# Patient Record
Sex: Male | Born: 1940 | Race: White | Hispanic: No | Smoking: Never smoker
Health system: Southern US, Community
[De-identification: ages and names within clinical notes are randomized; demographics above are authoritative.]

## PROBLEM LIST (undated history)

## (undated) DIAGNOSIS — B958 Unspecified staphylococcus as the cause of diseases classified elsewhere: Secondary | ICD-10-CM

## (undated) DIAGNOSIS — G709 Myoneural disorder, unspecified: Secondary | ICD-10-CM

## (undated) DIAGNOSIS — M778 Other enthesopathies, not elsewhere classified: Secondary | ICD-10-CM

## (undated) DIAGNOSIS — E119 Type 2 diabetes mellitus without complications: Secondary | ICD-10-CM

## (undated) DIAGNOSIS — Z860100 Personal history of colon polyps, unspecified: Secondary | ICD-10-CM

## (undated) DIAGNOSIS — C801 Malignant (primary) neoplasm, unspecified: Secondary | ICD-10-CM

## (undated) DIAGNOSIS — N400 Enlarged prostate without lower urinary tract symptoms: Secondary | ICD-10-CM

## (undated) DIAGNOSIS — E785 Hyperlipidemia, unspecified: Secondary | ICD-10-CM

## (undated) DIAGNOSIS — N183 Chronic kidney disease, stage 3 unspecified: Secondary | ICD-10-CM

## (undated) DIAGNOSIS — H9190 Unspecified hearing loss, unspecified ear: Secondary | ICD-10-CM

## (undated) DIAGNOSIS — I10 Essential (primary) hypertension: Secondary | ICD-10-CM

## (undated) DIAGNOSIS — M1990 Unspecified osteoarthritis, unspecified site: Secondary | ICD-10-CM

## (undated) DIAGNOSIS — K469 Unspecified abdominal hernia without obstruction or gangrene: Secondary | ICD-10-CM

## (undated) DIAGNOSIS — J189 Pneumonia, unspecified organism: Secondary | ICD-10-CM

## (undated) DIAGNOSIS — E039 Hypothyroidism, unspecified: Secondary | ICD-10-CM

## (undated) DIAGNOSIS — I499 Cardiac arrhythmia, unspecified: Secondary | ICD-10-CM

## (undated) DIAGNOSIS — Z87898 Personal history of other specified conditions: Secondary | ICD-10-CM

## (undated) DIAGNOSIS — Z87442 Personal history of urinary calculi: Secondary | ICD-10-CM

## (undated) DIAGNOSIS — H9319 Tinnitus, unspecified ear: Secondary | ICD-10-CM

## (undated) HISTORY — DX: Type 2 diabetes mellitus without complications: E11.9

## (undated) HISTORY — DX: Essential (primary) hypertension: I10

## (undated) HISTORY — DX: Benign prostatic hyperplasia without lower urinary tract symptoms: N40.0

## (undated) HISTORY — PX: SHOULDER ARTHROSCOPY: SHX128

## (undated) HISTORY — DX: Hypothyroidism, unspecified: E03.9

## (undated) HISTORY — DX: Chronic kidney disease, stage 3 (moderate): N18.3

## (undated) HISTORY — DX: Hyperlipidemia, unspecified: E78.5

## (undated) HISTORY — DX: Chronic kidney disease, stage 3 unspecified: N18.30

---

## 1968-11-02 HISTORY — PX: TONSILLECTOMY: SUR1361

## 1972-03-04 DIAGNOSIS — C801 Malignant (primary) neoplasm, unspecified: Secondary | ICD-10-CM

## 1972-03-04 HISTORY — DX: Malignant (primary) neoplasm, unspecified: C80.1

## 1972-03-04 HISTORY — PX: EYE SURGERY: SHX253

## 1973-03-04 HISTORY — PX: VASECTOMY: SHX75

## 1983-03-05 HISTORY — PX: BACK SURGERY: SHX140

## 1997-09-28 ENCOUNTER — Other Ambulatory Visit: Admission: RE | Admit: 1997-09-28 | Discharge: 1997-09-28 | Payer: Self-pay | Admitting: Urology

## 1997-10-04 ENCOUNTER — Encounter: Admission: RE | Admit: 1997-10-04 | Discharge: 1998-01-02 | Payer: Self-pay | Admitting: Family Medicine

## 1998-08-14 ENCOUNTER — Encounter (HOSPITAL_COMMUNITY): Admission: RE | Admit: 1998-08-14 | Discharge: 1998-10-02 | Payer: Self-pay | Admitting: Psychiatry

## 1999-06-11 ENCOUNTER — Encounter (INDEPENDENT_AMBULATORY_CARE_PROVIDER_SITE_OTHER): Payer: Self-pay | Admitting: *Deleted

## 1999-06-11 ENCOUNTER — Ambulatory Visit (HOSPITAL_COMMUNITY): Admission: RE | Admit: 1999-06-11 | Discharge: 1999-06-11 | Payer: Self-pay | Admitting: *Deleted

## 2000-01-03 ENCOUNTER — Encounter: Payer: Self-pay | Admitting: Specialist

## 2000-01-03 ENCOUNTER — Ambulatory Visit (HOSPITAL_COMMUNITY): Admission: RE | Admit: 2000-01-03 | Discharge: 2000-01-03 | Payer: Self-pay | Admitting: Specialist

## 2000-08-01 ENCOUNTER — Encounter: Payer: Self-pay | Admitting: Specialist

## 2000-08-08 ENCOUNTER — Observation Stay (HOSPITAL_COMMUNITY): Admission: RE | Admit: 2000-08-08 | Discharge: 2000-08-08 | Payer: Self-pay | Admitting: Specialist

## 2001-08-19 ENCOUNTER — Encounter: Admission: RE | Admit: 2001-08-19 | Discharge: 2001-08-19 | Payer: Self-pay | Admitting: Family Medicine

## 2001-08-19 ENCOUNTER — Encounter: Payer: Self-pay | Admitting: Family Medicine

## 2002-03-04 HISTORY — PX: BICEPS TENDON REPAIR: SHX566

## 2004-03-04 HISTORY — PX: SMALL INTESTINE SURGERY: SHX150

## 2005-02-04 ENCOUNTER — Inpatient Hospital Stay (HOSPITAL_COMMUNITY): Admission: EM | Admit: 2005-02-04 | Discharge: 2005-02-11 | Payer: Self-pay | Admitting: *Deleted

## 2006-09-21 ENCOUNTER — Ambulatory Visit: Payer: Self-pay | Admitting: Emergency Medicine

## 2007-09-05 ENCOUNTER — Emergency Department: Payer: Self-pay | Admitting: Emergency Medicine

## 2007-09-29 ENCOUNTER — Encounter (INDEPENDENT_AMBULATORY_CARE_PROVIDER_SITE_OTHER): Payer: Self-pay | Admitting: General Surgery

## 2007-09-29 ENCOUNTER — Ambulatory Visit (HOSPITAL_BASED_OUTPATIENT_CLINIC_OR_DEPARTMENT_OTHER): Admission: RE | Admit: 2007-09-29 | Discharge: 2007-09-29 | Payer: Self-pay | Admitting: General Surgery

## 2008-09-13 HISTORY — PX: NM MYOCAR PERF WALL MOTION: HXRAD629

## 2008-09-13 HISTORY — PX: US ECHOCARDIOGRAPHY: HXRAD669

## 2010-03-04 HISTORY — PX: COLONOSCOPY W/ POLYPECTOMY: SHX1380

## 2010-03-25 ENCOUNTER — Encounter: Payer: Self-pay | Admitting: Family Medicine

## 2010-05-23 ENCOUNTER — Other Ambulatory Visit: Payer: Self-pay | Admitting: *Deleted

## 2010-07-17 NOTE — Op Note (Signed)
[name], [name]                 ACCOUNT NO.:  [id number]   MEDICAL RECORD NO.:  [id number]          PATIENT TYPE:  AMB   LOCATION:  [facility]                         FACILITY:  [facility]   PHYSICIAN:  Angelia Mould. Derrell Lolling, M.D.DATE OF BIRTH:  09/30/1940   DATE OF PROCEDURE:  09/29/2007  DATE OF DISCHARGE:                               OPERATIVE REPORT   PREOPERATIVE DIAGNOSIS:  Soft tissue mass left back, 8 cm diameter.   POSTOPERATIVE DIAGNOSIS:  Soft tissue mass left back, 8 cm diameter.   OPERATION PERFORMED:  Excision 8 cm soft tissue mass left back.   SURGEON:  Dr. Claud Kelp.   INDICATIONS FOR PROCEDURE:  This is a 70 year old white man who noticed  a large lump on his left back below the left scapular tip about 8 months  ago.  It has not been painful.  Dr. Foy Guadalajara obtained a CT scan and an  ultrasound which suggested that this was a fatty tumor which was well  circumscribed.  On exam, the patient has what appears to be an 8-10 cm  soft tissue mass at the left back below the scapular tip.  There is no  adenopathy.  There is no overlying skin change.  I felt that this is  more likely a benign lipoma, but because of its significant size, I  agreed that excision was a very reasonable option and the patient wanted  to have that done.  He is brought to the operating room electively.   DESCRIPTION OF PROCEDURE:  Following the induction of general  endotracheal anesthesia, the patient was placed in the right lateral  decubitus position with appropriate padding and axillary roll.  The  patient was identified as correct patient, correct procedure and correct  site.  The left upper back and lateral chest wall were prepped and  draped in a sterile fashion.  Intravenous antibiotics were given.  I  used 0.5% Marcaine with epinephrine as a local infiltration anesthetic.  A linear, somewhat oblique, scar was made overlying the palpable mass.  Dissection was carried down through the skin and then  through the  superficial subcutaneous tissue.  I then entered the plane of the soft  tissue mass and with traction and counter traction and using  electrocautery, I dissected all the way around this.  This appeared to  be a firm well-circumscribed lipoma but had extended down into the  muscle fibers and had to be carefully dissected away from muscle fibers  to completely excise this mass.  Eventually, I got circumferential  excision in its entirety and removed this in one-piece en bloc.  The  specimen was sent for routine histology.  I examined the wound and it  appeared that all the tumor had been removed.  The wound was irrigated  with saline.  Hemostasis was excellent.  It appeared that I could close  the wound primarily without drainage as I could bring the tissue planes  together and obliterate the dead space.  I used about 6 interrupted  sutures of 3-0 Vicryl to do this, taking the deep subcutaneous tissue  and  the fascia on top of the muscle and after I placed all these  stitches, I then tied them down and it nicely obliterated the dead space  without any deformity.  Skin was closed with skin staples.  Clean  bandages placed and the patient taken to the recovery room in stable  condition.   ESTIMATED BLOOD LOSS:  About 10-15 mL.   COMPLICATIONS:  None.   Sponge, needle and instrument counts were correct.      Angelia Mould. Derrell Lolling, M.D.  Electronically Signed     HMI/MEDQ  D:  09/29/2007  T:  09/29/2007  Job:  [id number]   cc:   [name], M.D.  Fax: [phone]

## 2010-07-20 NOTE — Procedures (Signed)
[place]  Patient:    AMELIA, MACKEN                        MRN: [id number] Proc. Date: 06/11/99 Adm. Date:  [id number] Attending:  Mingo Amber CC:         Marinda Elk, M.D.                           Procedure Report  PROCEDURE:  Video colonoscopy.  INDICATIONS:  A 70 year old male whos father died of colon cancer at age 33.  PREPARATION:  He is npo since midnight having taken Phospho-soda prep and a clear liquid diet yesterday.  The mucosa throughout is clean.  DEPTH OF INSERTION:  Cecum.  PREPROCEDURE SEDATION:  He received a total of 75 mg of Demerol and 6 mg of Versed intravenously.  In addition he was on 2 liters of nasal cannula O2.  PROCEDURE:  The Olympus Video colonoscope was inserted via the rectum and advanced easily all the way to the hepatic flexure.  At this point extra abdominal pressure was required in order to attain the cecum.  The cecal landmarks were identified and photographed.  On withdrawal the mucosa was carefully evaluated.  The entire colon itself appeared normal.  At the anal verge in the rectum was a diminutive nodule that was quite firm that was biopsied to rule out a neoplastic lesion.  No other areas of neoplasia, inflammation, or diverticulosis were noted.  The patient tolerated the procedure well.  Pulse, blood pressure and oximetry testing were stable throughout.  He was observed in recovery for 45 minutes and discharged home, alert with a benign abdomen.  IMPRESSION: 1. Rectal nodule of doubtful significance. 2. Otherwise normal colonoscopy.  PLAN:  The patient will be advised of the polyp histology via a letter and I will notify him of any necessary follow up.  In light of his family history if this biopsy proves to be nothing of concern he still should consider having a repeat  colonoscopy in 5-10 years. DD:  06/11/99 TD:  06/12/99 Job: [id number]

## 2010-07-20 NOTE — Op Note (Signed)
[facility]  Patient:    [name], [name]                        MRN: [id number] Proc. Date: 08/08/00 Adm. Date:  [id number] Disc. Date: [id number] Attending:  Pierce Crane                           Operative Report  PREOPERATIVE DIAGNOSES: 1. Osteoarthrosis of the left shoulder. 2. Impingement syndrome.  POSTOPERATIVE DIAGNOSES: 1. Osteoarthrosis of the left shoulder. 2. Impingement syndrome.  OPERATION: 1. Left shoulder arthroscopy. 2. Subacromial decompression and bursectomy. 3. Lavage of humeral joint.  SURGEON:  Javier Docker, M.D.  BRIEF HISTORY: This is a 70 year old with refractory left shoulder pain.  MRI indicates end-stage osteoarthrosis of the shoulder.  This demonstrated positive impingement sign, secondary impingement sign.  Initially tender over the [facility] in the office.  Preoperatively, asked if he could avoid open procedure.   ______ the [facility] the positive impingement sign on internal and external rotation of shoulder.  Risks and benefits discussed including bleeding, infection, damage to  vascular structures, no change in symptoms, worsening symptoms, need for total shoulder arthroplasty in the future, need for procedure, etc.  TECHNIQUE:  The patient was placed supine in beach chair position after induction of adequate general anesthesia.  Kefzol 1 g given and left upper extremity, shoulder, and precordial regions prepped and draped in usual sterile fashion.   Surgical marking was utilized to delineate the acromion, clavicle, AC joint.  An Incision was made over the anterolateral aspect of the acromion to the skin only as well as the posterior inferior aspect of the shoulder through the skin only.  Initially, the shoulder was abducted.  The cannula advanced in line with the coracoid to the glenohumeral space.  There was significant contracture of the capsule, and full passage of the arthroscopic camera in the glenohumeral  joint was not obtainable.  It was felt that just the outer portion was penetrated.  It was just lavaged, hoping that some of the lavage would flush the glenohumeral joint.  Again in multiple passes, but the glenohumeral joint was not able to be "arthroscoped."  Review of his MRI demonstrated again the severe osteoarthrosis and spurring.  The camera was then redirected into the subacromial space, and a cannula was placed over the lateral aspect of the acromion.  An 18-gauge needle was utilized to localize the anterolateral aspect of the acromion and to the [facility] joint.  Significant subacromial bursa noted, and a bursectomy was performed with a 42 coude shaver.  Full inspection of the rotator cuff and internal and external rotation revealed no evidence of a tear.  There was a small spur on the anterior aspect of the acromion which was removed with a high-speed bur. There was no impingement noted from of the inferior aspect of the clavicle. There was good spacing in the subacromial joint.  Following bursectomy, again the rotator cuff felt to be intact.  It was felt that, due to his active tenderness over the [facility] joint preoperatively and that he would most likely require total shoulder arthroplasty, elected not to perform an open procedure. There was not enough motion on the clavicle delivered into the subacromial space to consider arthroscopic decompression.  It was, therefore, felt that the patient would most benefit from just the bursectomy that was performed, nd he would most likely need to proceed with a  total shoulder arthroplasty as we had discussed earlier.  Full internal and external rotation, full bursa had been excised.  There was no tear on the rotator cuff.  It was probed.  No impingement with range of motion.  All instrumentation was then removed. Portals were closed with 4-0 nylon interrupted sutures.  The wound was dressed sterilely.  He was placed in a sling, extubated without  difficulty, and transported to recovery room in satisfactory condition.  The patient tolerated the procedure well without complications. DD:  08/08/00 TD:  08/09/00 Job: [id number]

## 2010-07-20 NOTE — Op Note (Signed)
[name], [name]                 ACCOUNT NO.:  [id number]   MEDICAL RECORD NO.:  [id number]          PATIENT TYPE:  INP   LOCATION:  5505                         FACILITY:  [facility]   PHYSICIAN:  Angelia Mould. Derrell Lolling, M.D.DATE OF BIRTH:  Jan 11, 1941   DATE OF PROCEDURE:  02/05/2005  DATE OF DISCHARGE:                                 OPERATIVE REPORT   PREOPERATIVE DIAGNOSIS:  Acute mechanical small-bowel obstruction.   POSTOPERATIVE DIAGNOSIS:  Acute motility disorder, possible viral illness.   OPERATION PERFORMED:  1.  Diagnostic laparoscopy.  2.  Exploratory laparotomy.   SURGEON:  Claud Kelp, M.D.   OPERATIVE INDICATIONS:  This is a 70 year old white man who was admitted to  this hospital on December3, 2006 by Dr. Derenda Mis.  He had had  progressively worsening abdominal pain, severe abdominal distension, nausea  and vomiting for 2 days.  He had a little bit of diarrhea, but not a very  high-volume diarrhea.  He denied fever.  On admission, he had a white blood  cell count of 11,000.  Nasogastric tube was placed and after 24 hours, he  was still having some abdominal tenderness, although it was a little bit  better.  This morning he stated that he felt better and had less pain, but  was still distended, had passed minimal flatus and minimal stool.  His x-  rays this morning showed or high-grade small-bowel obstruction.  She had had  a CT scan that did not show any mass.  He has never had an abdominal  operation and does not have any obvious hernias.  Because of concern for  occult malignancy, it was felt best to proceed with surgical intervention.   OPERATIVE FINDINGS:  The patient did not have a clearly identified  obstructing point.  His proximal small bowel was massively dilated to 5-6 cm  and the distal small bowel was completely collapsed, but when I ran the  small bowel, there was just a gradual transition zone between dilated and  nondilated bowel.  I did not  clearly identify any adhesions.  I could not  palpate any mass within the small bowel.  I was able to milk small bowel  content both prograde and retrograde from through the transition point and  ultimately I milked the small bowel content back up into the stomach, where  it was evacuated through the NG tube.  The liver and gallbladder looked  normal.  The stomach felt normal.  The colon felt normal.   OPERATIVE TECHNIQUE:  Following the induction of general endotracheal  anesthesia, the patient's abdomen was prepped and draped in sterile fashion.  0.5% Marcaine with epinephrine was used for local infiltration anesthetic.  A 10-mm Hasson trocar was placed through a short vertically oriented  incision above the umbilicus; this was done as an open technique.  A 10 mm  Hasson cannula was secured with a pursestring suture of 0 Vicryl.  Pneumoperitoneum was created.  Video camera was inserted.  I could see the  proximal dilated small bowel and the distal collapsed small bowel.  Ultimately, we placed  three 5-mm trocars, 1 in the suprapubic area, 1 in the  right mid-abdomen and 1 in the left mid-abdomen.  I started with the patient  in Trendelenburg position and identified the cecum and the ligament Treitz.  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  The terminal ileum was collapsed and looked  fine.  Using Glassman clamps, I examined the bowel sequentially all the way  from the ileocecal valve backwards to the ligament of Treitz.  I then did  this prograde from the ligament of Treitz all the way down to the ileocecal  valve.  Findings were as described above.  I was concerned that I might be  missing a small polyp or malignancy that I could not see or palpate and so I  made a vertically oriented incision about 12 cm in length, just around the  umbilicus and above the umbilicus.  The abdomen was entered and explored.  Once again I  ran the small bowel twice.  I could milk the small bowel  content prograde and retrograde and then evacuated it retrograde out through  the NG tube.  I felt that given these operative findings that the patient  had an acute motility disorder, possibly related to an atypical viral  illness.  I checked around, found that the small bowel was looking much  better since it had been evacuated.  I put the small bowel back in its  anatomic position, checked to make sure there was no twisting and omentum  was returned to its anatomic position.  The midline fascia was closed with a  running suture of #1 PDS and the skin closed with skin staples.  Clean  bandages were placed and the patient taken to recovery room in stable  condition.  Estimated blood loss was about 40-50 mL.  Complications -- none.  Sponge, needle and instrument counts were correct.      Angelia Mould. Derrell Lolling, M.D.  Electronically Signed     HMI/MEDQ  D:  02/05/2005  T:  02/06/2005  Job:  [id number]   cc:   Melissa L. Ladona Ridgel, MD

## 2010-07-20 NOTE — Discharge Summary (Signed)
NAMECRISTINO, [name]                 ACCOUNT NO.:  [id number]   MEDICAL RECORD NO.:  [id number]          PATIENT TYPE:  INP   LOCATION:  5505                         FACILITY:  [facility]   PHYSICIAN:  [name], M.D. DATE OF BIRTH:  [birth year]   DATE OF ADMISSION:  02/03/2005  DATE OF DISCHARGE:  02/11/2005                                 DISCHARGE SUMMARY   DISCHARGE DIAGNOSES:  1.  Small bowel obstruction status post exploratory laparotomy which      revealed only acute motility disorder, no pathology identified.  2.  Hypothyroidism.  3.  Hypertension.  4.  Hypercholesterolemia.   DISCHARGE MEDICATIONS:  The patient is to resume home medications which  include: Synthroid, doxazosin, Verapamil, lovastatin, levothyroxine,  aspirin, multivitamin, and fish oil.   DISPOSITION:  The patient has been cleared by surgery for discharge to home  in stable condition.   RECOMMENDATIONS:  Follow up with general surgery as well as followup with  primary doctor in approximately two weeks.   CONSULTANTS:  General surgery.   PROCEDURE:  Status post exploratory laparotomy, no pathology noted.   LABORATORY DATA:  CBC at the time of discharge within normal limits.  BMET  was within normal limits.   HISTORY OF PRESENT ILLNESS:  A 70 year old male with the above medical  problems presented to the ED with complaints of abdominal pain and bloating  for two days.  In the ED, the patient was evaluated and admission was felt  to be necessary for further evaluation of small bowel obstruction.  Please  see dictated history and physical for further details.   PAST MEDICAL HISTORY:  As stated above.   MEDICATIONS:  As stated above.   HOSPITAL COURSE:  The patient was admitted to a medicine floor bed for  evaluation and treatment of small bowel obstruction.  The patient had follow-  up x-rays the following a.m. which revealed dilated loops of jejunum  clustered anteriorly, nondilated ileum.  There  was a concern for potential  internal hernia containing the jejunum with partial small bowel obstruction.  Based on those results, it was felt that the patient should undergo  exploratory laparotomy.  No internal hernia was identified.  Postoperative  diagnosis was acute motility disorder, questionable viral.  The patient was  returned to a hospital floor bed.  While there, the patient's bowel recovery  was somewhat slow to return and the patient continued to require NG tube for  a short duration postoperatively.  The patient was  ultimately resumed on oral medication which he has tolerated.  He is having  bowel movements without problems.  At this time, the patient is felt to be  stable for discharge to home.  Recommend further outpatient followup by  general surgery and the patient is asked to follow up with his primary  doctor.      [name], M.D.  Electronically Signed     RDP/MEDQ  D:  02/11/2005  T:  02/12/2005  Job:  [id number]   cc:   [name], M.D.  Fax: [phone]

## 2010-07-20 NOTE — H&P (Signed)
[name], [name]                 ACCOUNT NO.:  [id number]   MEDICAL RECORD NO.:  [id number]          PATIENT TYPE:  EMS   LOCATION:  [facility]JO                         FACILITY:  [facility]   PHYSICIAN:  Deirdre Peer. Polite, M.D. DATE OF BIRTH:  [birth year]   DATE OF ADMISSION:  02/03/2005  DATE OF DISCHARGE:                                HISTORY & PHYSICAL   CHIEF COMPLAINT:  Abdominal pain.   HISTORY OF PRESENT ILLNESS:  A 69 year old male with known history of  hypertension, hypercholesterolemia, and diabetes who presents to the ED with  complaints of abdominal pain.  The patient states that he was in his usual  health until Saturday morning.  The patient stated that he had some  discomfort in his belly and throughout the day had progressive discomfort  and some abdominal swelling.  On attempt to eat something, the patient had  an episode of emesis.  Denied any blood.  Denied any previous symptoms  similar to this.  Denied any prior surgeries.  The patient's symptoms did  not improve, therefore, he called his daughter, who recommended he come to  the ED.  In the ED, the patient was evaluated and found to be afebrile,  blood pressure 94/58, pulse 117, and respiratory rate of 18.  Upon sitting,  no orthostatic changes were noted.  The patient had blood work with a mild  leukocytosis of 11,000, renal insufficiency, creatinine 1.9, specific  gravity in the urine 1.030, rare bacteria.  The patient's abdominal series  did show dilated loops of small bowel consistent with small bowel  obstruction.  Admission to the hospital was recommended, therefore [facility] was called for further evaluation.  At the time of my  evaluation, the patient still had significant distress secondary to  abdominal pain.  Denies any fever or chills.  Does admit to episode of  nausea x1 as stated without blood.  He states he has been having some  stools, occasional diarrhea, and again no blood.  The patient denies  any  unusual food intakes, seafood, [place] food, etc.  Unsure of status of well  water.  Admission is deemed necessary for further evaluation and treatment  of small bowel obstruction.   PAST MEDICAL HISTORY:  As stated above.   MEDICATIONS:  1.  Synthroid.  2.  Doxazosin 4 mg.  3.  Fish Oil.  4.  Aspirin.  5.  Multivitamin.  6.  Verapamil 120 mg.  7.  Lovastatin.  8.  Levothyroxine 88 mcg.   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  The patient admits to back surgery in the 80's, right  arm biceps tendon repair, left arm shoulder surgery.   ALLERGIES:  No known drug allergies.   FAMILY HISTORY:  Noncontributory.   REVIEW OF SYSTEMS:  As stated in the HPI.   PHYSICAL EXAMINATION:  GENERAL:  The patient is in moderate distress  secondary to abdominal discomfort.  HEENT:  Anicteric sclerae, moist oral mucosa.  No nodes and no JVD.  CHEST:  Clear to auscultation bilaterally.  CARDIOVASCULAR:  Regular, no S3.  ABDOMEN:  Distended with tympany.  No rebound tenderness.  Minimal guarding.  No mass appreciated.  EXTREMITIES:  No cyanosis, clubbing, or edema.  RECTAL:  Without mass.  Hemoccult is pending at the time of this dictation.  NEUROLOGY:  Nonfocal.   LABORATORY DATA:  As stated above.  CBC; white count 11,000, hemoglobin 15,  neutrophil count 85%.  CMET significant for a creatinine of 1.9, normal  LFT's, normal bilirubin.  UA significant for a specific gravity of 1.030,  nitrite negative, rare bacteria.   Abdominal series; dilated loops of small bowel.  No free air.  Consistent  with small bowel obstruction.   ASSESSMENT:  1.  Small bowel obstruction.  Please note the patient is without any history      of any abdominal surgeries or any prior events.  2.  Nausea, vomiting, and abdominal pain.  3.  Renal insufficiency.  4.  Mild leukocytosis.   PLAN:  Recommend the patient to be admitted to a medicine  floor bed.  The  patient will be placed on bowel rest. If there is recurrent events of nausea  or vomiting, NG tube will be placed.  The patient will be given judicious IV  fluids, antiemetics, and empiric antibiotics.  We will have follow-up  abdominal series in the a.m.  If there is worsening or no change, we will  obtain a surgical consultation and CT of the abdomen.  We will make further  recommendations after review of the above studies.      Deirdre Peer. Polite, M.D.  Electronically Signed     RDP/MEDQ  D:  02/04/2005  T:  02/04/2005  Job:  [id number]

## 2010-07-20 NOTE — H&P (Signed)
[facility]  Patient:    [name], PINSON                       MRN: [id number] Attending:  Javier Docker, M.D. Dictator:   Irena Cords, P.A.-C CC:         Marinda Elk, M.D.   History and Physical  DATE OF BIRTH:  1940/10/31  CHIEF COMPLAINT:  Left shoulder pain.  HISTORY OF PRESENT ILLNESS:  Rakwon Letourneau is a 70 year old right-hand-dominant male who has had left shoulder pain for more than two years.  He has seen Dr. Javier Docker since July of 2001 for this shoulder.  He has been on various anti-inflammatory medications without relief.  He has undergone a corticosteroid injection in that shoulder with some temporary relief.  He subsequently underwent an MRI study which demonstrated arthritis of the glenohumeral joint and some mild tendinopathy.  Because of the lack of improvement with conservative measures, he elected to undergo surgery on that shoulder.  He has been sent to see his primary physician, Dr. Marinda Elk, for preoperative medical evaluation and clearance.  They felt he was stable for surgery.  REVIEW OF SYSTEMS:  PSYCHIATRIC:  Patient denies any depression or anxiety. NEUROLOGIC:  Denies any headaches, blurred vision or dizziness.  Denies any seizures.  No strokes.  HEENT:  No diplopia.  No blurred vision.  No sore throat or rhinorrhea.  No earache.  CARDIOPULMONARY:  Denies any chest pain or shortness of breath.  No cough.  GI AND GU:  Denies any abdominal pain.  No nausea, vomiting, diarrhea or constipation.  No hematuria.  No dysuria, frequency or urgency.  No melena.  MUSCULOSKELETAL:  As per the HPI.  PAST MEDICAL HISTORY:  Significant for history of kidney stones, hemorrhoids, thyroid disorder with goiter, arthritis, anxiety and depression, diet-controlled diabetes, enlarged prostate, tinnitus.  Patient denies any history of hypertension, heart or lung disease, anesthesia problems or reactions or infectious  diseases.  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  CURRENT MEDICATIONS: 1. Sonata 10 mg p.o. q.h.s. 2. Fluoxetine 20 mg p.o. t.i.d. 3. Urocit-K 1080 mg p.o. b.i.d. 4. Doxazosin 4 mg p.o. q.d. 5. Celebrex 200 mg p.o. b.i.d. 6. Synthroid 0.075 mg p.o. q.d. 7. Diazepam 5 mg.  ALLERGIES:  No known drug allergies.  SOCIAL HISTORY:  Patient is married and has four healthy children.  He is retired.  He denies alcohol or tobacco use.  FAMILY HISTORY:  Significant for lung cancer in his mother and colon cancer in his father.  No family members with diabetes, heart disease or anesthesia reactions or problems.  PHYSICAL EXAMINATION:  VITAL SIGNS:  Temperature 98.3, pulse 68, respiratory 14 and blood pressure 118/80.  GENERAL:  This is a well-developed, well-nourished male in no acute distress.  HEENT:  Head is atraumatic, normocephalic.  Right pupil is equal, round and reactive to light and extraocular movement in that eye is intact.  Oropharynx is clear without redness or exudate.  TMs are clear without redness.  NECK:  Supple without cervical adenopathy.  He does have some thyromegaly.  No tenderness.  LUNGS:  Clear to auscultation bilaterally with no wheezes or crackles.  HEART:  Regular rate and rhythm with no murmurs, rubs, or gallops.  ABDOMEN:  Soft, nontender, nondistended, without masses.  No hepatosplenomegaly.  Positive bowel sounds.  GENITOURINARY:  Not examined and not pertinent to present illness.  BREASTS:  Not examined and not pertinent to present illness.  EXTREMITIES:  He has pain in the left shoulder with glenohumeral loading. Negative impingement sign.  He has 5/5 motor strength in both upper extremities.  SKIN:  Intact without rashes or lesions.  LABORATORY STUDIES:  Pending.  RADIOGRAPH STUDIES:  MRI demonstrated arthritis of the glenohumeral joint  and mild tendinopathy in the left shoulder.  IMPRESSION: 1. Left shoulder degenerative joint disease. 2. Thyroid disorder with goiter. 3. Anxiety and depression history. 4. Diet-controlled diabetes.  PLAN:  Patient will be admitted on June 7th for a left shoulder debridement and possible open rotator cuff repair with subacromial decompression and distal clavicle resection by Dr. Shelle Iron.  We are awaiting the written medical clearance from patients primary physician.  We have answered all questions for the patient. DD:  08/05/00 TD:  08/06/00 Job: [id number]

## 2010-07-20 NOTE — Consult Note (Signed)
[name], [name]                 ACCOUNT NO.:  [id number]   MEDICAL RECORD NO.:  [id number]          PATIENT TYPE:  INP   LOCATION:  5505                         FACILITY:  [facility]   PHYSICIAN:  Velora Heckler, MD      DATE OF BIRTH:  September 09, 1940   DATE OF CONSULTATION:  02/04/2005  DATE OF DISCHARGE:                                   CONSULTATION   GENERAL SURGERY CONSULTATION   CHIEF COMPLAINT  Abdominal Pain   HISTORY OF PRESENT ILLNESS  [name] is a 70 year old male who was admitted on February 04, 2005 with  progressive abdominal pain, bloating, nausea and vomiting for two days.  Plain films in the emergency room showed a small bowel obstruction.  He was  kept n.p.o., started with an  tube and was hydrated.  Earlier today he  states that he had a formed bowel movement and then had less distention.  Initially the patient had leukocytosis of 11,000 and now this is decreased  to 7600.   PAST MEDICAL HISTORY:  1.  Hypertension.  2.  Hypercholesterolemia.  3.  Diabetes mellitus.  4.  Hypothyroidism.  5.  He has had left eye surgery secondary to cancer.  6.  He has had back surgery in the 1980s.  7.  He has a right biceps repair, left shoulder surgery.  He denies any abdominal surgeries.   FAMILY HISTORY:  Noncontributory.   SOCIAL HISTORY:  Married, no tobacco or alcohol use.   ALLERGIES:  NO KNOWN DRUG ALLERGIES.   MEDICATIONS:  Reviewed and include:  1.  Lovenox.  2.  Protonix.  3.  Synthroid.  4.  Unasyn.  5.  Ambien.  6.  Phenergan as needed.  7.  Tylenol as needed.   VITALS:  Temperature 99, blood pressure 136/85, pulse 84, respirations 20.  HEENT:  Grossly normal.  No carotid or subclavian bruits, no JVD or  thyromegaly.  Sclerae are clear.  Conjunctivae normal and ears without  drainage.  CHEST:  Clear to auscultation bilaterally, no rales, no wheeze, no rhonchi.  HEART:  Regular rate and rhythm, no murmur.  ABDOMEN:  Soft, diffusely, was sore, he has no  evidence of peritonitis.  He does have good bowel sounds, but these are faint.  He has an umbilical  hernia that is easily reducible.  EXTREMITIES:  No peripheral edema.  SKIN:  Warm and dry.  NEUROLOGIC:  Grossly intact.   ASSESSMENT/PLAN:  1.  Small bowel obstruction.  2.  Hypertension.  3.  Hypercholesterolemia.  4.  Diabetes mellitus.  5.  Hypothyroidism.  6.  Multiple surgeries.   At this point we will keep the patient n.p.o., continue IV fluids, and  continue the  tube and check an EKG in the event that he may need surgery  overnight.  He has not had a CT scan since his admission, and I am going to  order that stat.  Dr. Gerrit Friends will follow-up with the patient after the CT  scan has been performed.      [name], P.A.  Velora Heckler, MD  Electronically Signed    LB/MEDQ  D:  02/05/2005  T:  02/05/2005  Job:  [id number]   cc:   Corinna L. Lendell Caprice, MD

## 2010-11-30 LAB — CBC
MCHC: 33.7
Platelets: 175
RDW: 14

## 2010-11-30 LAB — DIFFERENTIAL
Eosinophils Absolute: 0.2
Eosinophils Relative: 3
Lymphs Abs: 1.8
Monocytes Relative: 7
Neutrophils Relative %: 52

## 2010-11-30 LAB — COMPREHENSIVE METABOLIC PANEL
ALT: 29
AST: 30
Calcium: 9.7
GFR calc Af Amer: 40 — ABNORMAL LOW
Sodium: 138
Total Protein: 7.1

## 2010-11-30 LAB — GLUCOSE, CAPILLARY

## 2012-12-01 ENCOUNTER — Ambulatory Visit (INDEPENDENT_AMBULATORY_CARE_PROVIDER_SITE_OTHER): Payer: PRIVATE HEALTH INSURANCE | Admitting: Cardiology

## 2012-12-01 ENCOUNTER — Encounter: Payer: Self-pay | Admitting: Cardiology

## 2012-12-01 VITALS — BP 132/84 | HR 52 | Ht 70.0 in | Wt 213.5 lb

## 2012-12-01 DIAGNOSIS — I10 Essential (primary) hypertension: Secondary | ICD-10-CM | POA: Insufficient documentation

## 2012-12-01 DIAGNOSIS — E669 Obesity, unspecified: Secondary | ICD-10-CM | POA: Insufficient documentation

## 2012-12-01 DIAGNOSIS — E119 Type 2 diabetes mellitus without complications: Secondary | ICD-10-CM | POA: Insufficient documentation

## 2012-12-01 DIAGNOSIS — E785 Hyperlipidemia, unspecified: Secondary | ICD-10-CM

## 2012-12-01 DIAGNOSIS — E1169 Type 2 diabetes mellitus with other specified complication: Secondary | ICD-10-CM | POA: Insufficient documentation

## 2012-12-01 MED ORDER — LOSARTAN POTASSIUM 25 MG PO TABS: 25.0000 mg | ORAL_TABLET | Freq: Every day | ORAL | Status: DC

## 2012-12-01 NOTE — Assessment & Plan Note (Signed)
He is hoping that if he get back to his exercise routine, he can lose some weight that he has currently put on with knee pain.  He has gained 4 pounds over last couple months.  I explained that visit combination between dietary modification and exercise.  We discussed various dietary options.  He understands.

## 2012-12-01 NOTE — Progress Notes (Signed)
PCP: Lenora Boys, MD  Clinic Note: Chief Complaint  Patient presents with  . Annual Exam    no chest pain ,no sob, no edema   HPI: [name] is a 72 y.o. male with a PMH below who presents today for annual followup.   I last saw him in June of 2013.  Interval History: [name] comes in today feeling quite well  From a cardiac standpoint.  He denies any significant symptoms of chest pressure or tightness with rest or exertion.  No dyspnea at rest or exertion.  No PND, orthopnea or edema. He has been bothered by some pain in his right knee for which he can go see the orthopedic surgeons today.  He was in the area and felt like he should come back for a followup visit.  He was 1 to reestablish himself here in light of the potential need for knee surgery.  The remainder of Cardiovascular ROS: no chest pain or dyspnea on exertion negative for - chest pain, edema, irregular heartbeat, loss of consciousness, murmur, orthopnea, palpitations, paroxysmal nocturnal dyspnea, rapid heart rate or shortness of breath: Additional cardiac review of systems: Lightheadedness - no, dizziness - no, syncope/near-syncope - no; TIA/amaurosis fugax - no Melena - no, hematochezia no; hematuria - no; nosebleeds - no; claudication - no  Past Medical History  Diagnosis Date  . Type II diabetes mellitus, well controlled     with diet and exercise  . Hypertension, essential, benign   . Dyslipidemia     On fenofibrate and fish oil  . Hypothyroidism   . BPH (benign prostatic hyperplasia)      followed by Dr. Isabel Caprice  . CKD (chronic kidney disease) stage 3, GFR 30-59 ml/min      Dr. Abel Presto    Prior Cardiac Evaluation and Past Surgical History: History reviewed. No pertinent past surgical history.    Allergies  Allergen Reactions  . Advicor [Niacin-Lovastatin Er] Other (See Comments)    sweating  . Lisinopril Cough    Current Outpatient Prescriptions  Medication Sig Dispense Refill  .  aspirin EC 81 MG tablet Take 81 mg by mouth daily.      Marland Kitchen BIOTIN PO Take by mouth.      . desmopressin (DDAVP) 0.2 MG tablet Take 0.2 mg by mouth daily.      Marland Kitchen doxazosin (CARDURA) 4 MG tablet Take 4 mg by mouth at bedtime.      . fenofibrate micronized (LOFIBRA) 134 MG capsule Take 134 mg by mouth daily before breakfast.      . levothyroxine (SYNTHROID, LEVOTHROID) 100 MCG tablet Take 100 mcg by mouth daily before breakfast.      . losartan (COZAAR) 25 MG tablet Take 1 tablet (25 mg total) by mouth daily.  90 tablet  3  . oxybutynin (DITROPAN-XL) 5 MG 24 hr tablet Take 5 mg by mouth daily.       No current facility-administered medications for this visit.    History   Social History Narrative   He is a widowed father of 4, grandfather and 8 with 2 great grandchildren.  Up until the last few weeks when his knee is really been hurting him, his attorney regular exercise.  He was a member of [employer], and so I am routinely.   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  He enjoys traveling.   He does not smoke, and does not drink alcohol.  ROS: A comprehensive Review of Systems - Negative except Right knee pain.  This is limiting his ability to walk for exercise.  PHYSICAL EXAM BP 132/84  Pulse 52  Ht 5\' 10"  (1.778 m)  Wt 213 lb 8 oz (96.843 kg)  BMI 30.63 kg/m2 General appearance: alert, cooperative, appears stated age, no distress, mildly obese and Otherwise healthy-appearing, well-nourished and well-groomed.  Answers questions appropriately.  Alert and oriented x3. Neck: no adenopathy, no carotid bruit, no JVD, supple, symmetrical, trachea midline and thyroid not enlarged, symmetric, no tenderness/mass/nodules Lungs: clear to auscultation bilaterally, normal percussion bilaterally and Nonlabored, good air movement Heart: regular rate and rhythm, S1, S2 normal, no murmur, click, rub or gallop and normal apical impulse Abdomen: soft, non-tender;  bowel sounds normal; no masses,  no organomegaly and Mild truncal obesity Extremities: extremities normal, atraumatic, no cyanosis or edema, no edema, redness or tenderness in the calves or thighs, no ulcers, gangrene or trophic changes, varicose veins noted and There is mild swelling but no other erythema on the right knee Pulses: 2+ and symmetric  QIO:[id number] today: Yes Rate: 52 , Rhythm: Sinus bradycardia, otherwise normal ECG; no significant change  Recent Labs: From PCP 11/16/2012  CBC: WBC 7.2, H&H 14.3/40.8.  Platelets 209  CMP: Sodium 140, potassium 4.9, chloride 99, bicarbonate 25, BUN 30, creatinine 1.3, glucose 83.  GFR 36  LFTs essentially normal  ASSESSMENT / PLAN: Essential hypertension Well controlled on losartan.  He says that he has had some elevated pressures.  We may get the wound may need to increase his Cozaar.  But for now I think it we will control.  I would defer adjustment of medications to Dr.Colodonato.   Obesity (BMI 30-39.9) He is hoping that if he get back to his exercise routine, he can lose some weight that he has currently put on with knee pain.  He has gained 4 pounds over last couple months.  I explained that visit combination between dietary modification and exercise.  We discussed various dietary options.  He understands.  HTN (hypertension) Well controlled on losartan.  He says that he has had some elevated pressures.  We may get the wound may need to increase his Cozaar.  But for now I think it we will control.  I would defer adjustment of medications to Dr.Colodonato.  Dyslipidemia I don't know his current labs show.  He is on fenofibrate.  He said that he does have LFTs and lipids checked by his primary physician as well.  We are going to try to get those results.    Orders Placed This Encounter  Procedures  . EKG 12-Lead   Followup: One year  DAVID W. Herbie Baltimore, M.D., M.S. [facility] [address]  [address] [place], [place]  [address]  [phone] Pager # [phone]

## 2012-12-01 NOTE — Assessment & Plan Note (Signed)
Well controlled on losartan.  He says that he has had some elevated pressures.  We may get the wound may need to increase his Cozaar.  But for now I think it we will control.  I would defer adjustment of medications to Dr.Colodonato.  

## 2012-12-01 NOTE — Assessment & Plan Note (Signed)
Well controlled on losartan.  He says that he has had some elevated pressures.  We may get the wound may need to increase his Cozaar.  But for now I think it we will control.  I would defer adjustment of medications to Dr.Colodonato.

## 2012-12-01 NOTE — Patient Instructions (Signed)
Your physician wants you to follow-up in 12 month HARDING.  You will receive a reminder letter in the mail two months in advance. If you don't receive a letter, please call our office to schedule the follow-up appointment.  WE NEED TO SEE YOUR LABS FROM PCP AND kidney DOCTOR ,WILL CALL AFTER  THEY ARE REVIEWED

## 2012-12-01 NOTE — Assessment & Plan Note (Signed)
I don't know his current labs show.  He is on fenofibrate.  He said that he does have LFTs and lipids checked by his primary physician as well.  We are going to try to get those results.

## 2012-12-02 ENCOUNTER — Ambulatory Visit: Payer: Self-pay | Admitting: Cardiology

## 2012-12-03 ENCOUNTER — Ambulatory Visit: Payer: Self-pay | Admitting: Cardiology

## 2012-12-09 ENCOUNTER — Encounter: Payer: Self-pay | Admitting: Cardiology

## 2013-01-18 ENCOUNTER — Inpatient Hospital Stay: Admit: 2013-01-18 | Payer: Self-pay | Admitting: Specialist

## 2013-01-18 SURGERY — ARTHROPLASTY, KNEE, TOTAL
Anesthesia: General | Laterality: Right

## 2013-01-19 ENCOUNTER — Other Ambulatory Visit: Payer: Self-pay | Admitting: Orthopedic Surgery

## 2013-01-20 ENCOUNTER — Other Ambulatory Visit: Payer: Self-pay | Admitting: Orthopedic Surgery

## 2013-02-24 ENCOUNTER — Encounter (HOSPITAL_COMMUNITY): Payer: Self-pay | Admitting: Pharmacy Technician

## 2013-03-02 ENCOUNTER — Other Ambulatory Visit: Payer: Self-pay | Admitting: Orthopedic Surgery

## 2013-03-02 NOTE — H&P (Signed)
[name] DOB: 03-12-1940 Male  H&P date: 03/02/13  Chief complaint: L knee pain  History of Present Illness The patient is a 72 year old male who comes in today for a preoperative History and Physical. The patient is scheduled for a left total knee arthroplasty to be performed by Dr. Javier Docker, MD at [facility] on 03/11/2013. Pt with many years of B/L knee pain, more consistently the left within the last several months. He was last seen in November and discussed TKA with Dr. Shelle Iron, had an injection in the right knee at that time. The right knee is actually doing pretty well right now, less severe than the left. He occasionally wears his brace for support. He has modified his activity to prevent worsening pain. He does note stiffness. He has had to cut back on regular activities and pain is limiting ADL's at this point. Refractory to steroid injections which had previously been helpful.  Dr. Shelle Iron and the patient mutually agreed to proceed with a total knee replacement. Risks and benefits of the procedure were discussed including stiffness, suboptimal range of motion, persistent pain, infection requiring removal of prosthesis and reinsertion, need for prophylactic antibiotics in the future, or example: dental procedures, possible need for manipulation, revision in the future and also anesthetic complications including DVT, PE, etc. We discussed the perioperative course, time in the hospital, postoperative recovery, and the need for elevation to control swelling. We also discussed the predicted range of motion and the probability that squatting and kneeling would be unobtainable in the future. In addition, postoperative anticoagulation was discussed. We have obtained preoperative medical clearance -Dr. Foy Guadalajara. Provided the patient with illustrated handouts and discussed it in detail. They will enroll in the total joint replacement educational forum at the  hospital.  Allergies No Known Drug Allergies. 12/01/2012  Family History Cerebrovascular Accident. grandfather fathers side Chronic Obstructive Lung Disease. grandmother fathers side Congestive Heart Failure. grandmother mothers side Heart Disease. grandmother mothers side and grandfather mothers side Hypertension. mother Rheumatoid Arthritis. child Cancer. First Degree Relatives. mother and father  Social History Alcohol use. never consumed alcohol Children. 1 Current work status. working part time Drug/Alcohol Rehab (Currently). no Exercise. Exercises weekly; does running / walking Illicit drug use. no Living situation. live alone Marital status. widowed Most recent primary occupation. Substitute teacher Number of flights of stairs before winded. 4-5 Pain Contract. no Previously in rehab. no Tobacco / smoke exposure. no Tobacco use. Never smoker. never smoker Post-Surgical Plans. rehab- [facility], [facility]. living will  Medication History Levothyroxine Sodium ( Tablet, Oral) Active. Doxazosin Mesylate (4MG  Tablet, Oral) Active. Fenofibrate (134MG  Capsule, Oral) Active. Losartan Potassium (25MG  Tablet, Oral) Active. Oxybutynin Chloride (5MG  Tablet, Oral) Active. Desmopressin Acetate (0.1MG  Tablet, Oral) Active. Aspirin (81MG  Tablet, 1 (one) Oral) Active. Biotin ( Tablet, Oral) Active.  Past Surgical History Arthroscopy of Shoulder. left Colon Polyp Removal - Colonoscopy Other Surgery. Tendon Biceps Repair, Right Arm. Intestinal blockage. Spinal Surgery Tonsillectomy Vasectomy Enucleation of Eyeball-Left. 1974; [place]  Past Medical Hx Cancer. L eye, 1974 Chronic Pain Chronic Renal Failure Syndrome. stage 2 Diabetes Mellitus, Type II. diet controlled High blood pressure Hypercholesterolemia Hypothyroidism Kidney Stone Prostate Disease Arthritis Impaired Vision Impaired  Hearing Tinnitus  Review of Systems General:Not Present- Chills, Fever, Night Sweats, Fatigue, Weight Gain, Weight Loss and Memory Loss. Skin:Not Present- Hives, Itching, Rash, Eczema and Lesions. HEENT:Present- Tinnitus and Hearing Loss. Not Present- Headache, Double Vision, Visual Loss and Dentures. Respiratory:Not Present- Shortness of  breath with exertion, Shortness of breath at rest, Allergies, Coughing up blood and Chronic Cough. Cardiovascular:Not Present- Chest Pain, Racing/skipping heartbeats, Difficulty Breathing Lying Down, Murmur, Swelling and Palpitations. Gastrointestinal:Not Present- Bloody Stool, Heartburn, Abdominal Pain, Vomiting, Nausea, Constipation, Diarrhea, Difficulty Swallowing, Jaundice and Loss of appetitie. Male Genitourinary:Present- Urinary frequency and Urinating at Night. Not Present- Blood in Urine, Weak urinary stream, Discharge, Flank Pain, Incontinence, Painful Urination, Urgency and Urinary Retention. Musculoskeletal:Present- Joint Pain. Not Present- Muscle Weakness, Muscle Pain, Joint Swelling, Back Pain, Morning Stiffness and Spasms. Neurological:Not Present- Tremor, Dizziness, Blackout spells, Paralysis, Difficulty with balance and Weakness. Psychiatric:Not Present- Insomnia.  Physical Exam The physical exam findings are as follows:  General Mental Status - Alert, cooperative and good historian. General Appearance- pleasant. Not in acute distress. Orientation- Oriented X3. Build & Nutrition- Well nourished and Well developed.  Head and Neck Head- normocephalic, atraumatic . Neck Global Assessment- supple. no bruit auscultated on the right and no bruit auscultated on the left.  Eye Pupil- Bilateral- Regular and Round. Motion- Bilateral- EOMI.  Chest and Lung Exam Auscultation: Breath sounds:- clear at anterior chest wall and - clear at posterior chest wall. Adventitious sounds:- No Adventitious  sounds.  Cardiovascular Auscultation:Rhythm- Regular rate and rhythm. Heart Sounds- S1 WNL and S2 WNL. Murmurs & Other Heart Sounds:Auscultation of the heart reveals - No Murmurs.  Abdomen Palpation/Percussion:Tenderness- Abdomen is non-tender to palpation. Rigidity (guarding)- Abdomen is soft. Auscultation:Auscultation of the abdomen reveals - Bowel sounds normal.  Male Genitourinary Not done, not pertinent to present illness  Musculoskeletal Left knee, trace effusion. Tender medial and lateral joint line. Patellofemoral pain with compression. ROM 0-110.  Knee exam on inspection reveals no evidence of soft tissue swelling, ecchymosis, deformity, or erythema. Nontender over the fibular head of the peroneal nerve. Nontender over the quadriceps insertion of the patellar ligament insertion. Provocative maneuvers revealed a negative Lachman, negative anterior and posterior drawer, and a negative McMurray's. No instability was noted with varus and valgus stressing at 0 or 30 degrees. On manual motor test the quadriceps and hamstrings were 5/5. Sensory exam was intact to light touch.  Imaging AP standing and lateral demonstrates endstage osteoarthrosis, varus deformity right knee. The left knee slight valgus with severe tricompartmental osteoarthrosis.  Assessment & Plan DJD L knee  Pt with end-stage L knee DJD refractory to conservative tx as listed above, scheduled for L TKA by Dr. Shelle Iron on 03/11/13. He has been cleared by his PCP Dr. Foy Guadalajara. We discussed the surgery itself as well as risks, complications, alternatives including but not limited to DVT, PE, infx, bleeding, failure of procedure, need for secondary procedure, anesthesia risk, even death. Discussed post-op protocols, need for PT, activity modifications and limitations, DVT and infx ppx. All questions were answered and he desires to proceed as scheduled. Will [facility] as [facility] this week for pre-op testing. He will  remain NPO after MN prior to surgery. Will hold ASA accordingly. Plan rehab center post-op, xarelto for DVT ppx. He will follow up 10-14 days post-op for staple removal and xrays and will call with any questions or concerns in the interim.  Plan left total knee replacement  Signed electronically by Dorothy Spark, PA-C for Dr. Shelle Iron

## 2013-03-05 ENCOUNTER — Encounter (HOSPITAL_COMMUNITY)
Admission: RE | Admit: 2013-03-05 | Discharge: 2013-03-05 | Disposition: A | Discharge status: Home / Self Care | Payer: Medicare Other | Source: Ambulatory Visit | Attending: Specialist | Admitting: Specialist

## 2013-03-05 ENCOUNTER — Ambulatory Visit (HOSPITAL_COMMUNITY)
Admission: RE | Admit: 2013-03-05 | Discharge: 2013-03-05 | Disposition: A | Discharge status: Home / Self Care | Payer: Medicare Other | Source: Ambulatory Visit | Attending: Orthopedic Surgery | Admitting: Orthopedic Surgery

## 2013-03-05 ENCOUNTER — Ambulatory Visit (HOSPITAL_COMMUNITY)
Admission: RE | Admit: 2013-03-05 | Discharge: 2013-03-05 | Disposition: A | Discharge status: Home / Self Care | Payer: Medicare Other | Source: Ambulatory Visit | Attending: Specialist | Admitting: Specialist

## 2013-03-05 ENCOUNTER — Encounter (HOSPITAL_COMMUNITY): Payer: Self-pay

## 2013-03-05 DIAGNOSIS — R918 Other nonspecific abnormal finding of lung field: Secondary | ICD-10-CM | POA: Insufficient documentation

## 2013-03-05 DIAGNOSIS — Z01812 Encounter for preprocedural laboratory examination: Secondary | ICD-10-CM | POA: Insufficient documentation

## 2013-03-05 DIAGNOSIS — Z01818 Encounter for other preprocedural examination: Secondary | ICD-10-CM | POA: Insufficient documentation

## 2013-03-05 DIAGNOSIS — Z0183 Encounter for blood typing: Secondary | ICD-10-CM | POA: Insufficient documentation

## 2013-03-05 DIAGNOSIS — R05 Cough: Secondary | ICD-10-CM | POA: Insufficient documentation

## 2013-03-05 DIAGNOSIS — M259 Joint disorder, unspecified: Secondary | ICD-10-CM | POA: Insufficient documentation

## 2013-03-05 DIAGNOSIS — R059 Cough, unspecified: Secondary | ICD-10-CM | POA: Insufficient documentation

## 2013-03-05 DIAGNOSIS — M25569 Pain in unspecified knee: Secondary | ICD-10-CM | POA: Insufficient documentation

## 2013-03-05 HISTORY — DX: Unspecified abdominal hernia without obstruction or gangrene: K46.9

## 2013-03-05 HISTORY — DX: Unspecified hearing loss, unspecified ear: H91.90

## 2013-03-05 HISTORY — DX: Malignant (primary) neoplasm, unspecified: C80.1

## 2013-03-05 HISTORY — DX: Unspecified staphylococcus as the cause of diseases classified elsewhere: B95.8

## 2013-03-05 HISTORY — DX: Other enthesopathies, not elsewhere classified: M77.8

## 2013-03-05 HISTORY — DX: Personal history of urinary calculi: Z87.442

## 2013-03-05 HISTORY — DX: Personal history of colon polyps, unspecified: Z86.0100

## 2013-03-05 HISTORY — DX: Tinnitus, unspecified ear: H93.19

## 2013-03-05 HISTORY — DX: Unspecified osteoarthritis, unspecified site: M19.90

## 2013-03-05 HISTORY — DX: Personal history of colonic polyps: Z86.010

## 2013-03-05 LAB — CBC
HEMATOCRIT: 43.8 % (ref 39.0–52.0)
Hemoglobin: 14.7 g/dL (ref 13.0–17.0)
MCH: 29.8 pg (ref 26.0–34.0)
MCHC: 33.6 g/dL (ref 30.0–36.0)
MCV: 88.8 fL (ref 78.0–100.0)
Platelets: 213 10*3/uL (ref 150–400)
RBC: 4.93 MIL/uL (ref 4.22–5.81)
RDW: 13.5 % (ref 11.5–15.5)
WBC: 6.6 10*3/uL (ref 4.0–10.5)

## 2013-03-05 LAB — URINALYSIS, ROUTINE W REFLEX MICROSCOPIC
Bilirubin Urine: NEGATIVE
Glucose, UA: NEGATIVE mg/dL
Hgb urine dipstick: NEGATIVE
KETONES UR: NEGATIVE mg/dL
LEUKOCYTES UA: NEGATIVE
NITRITE: NEGATIVE
Protein, ur: NEGATIVE mg/dL
Specific Gravity, Urine: 1.018 (ref 1.005–1.030)
Urobilinogen, UA: 0.2 mg/dL (ref 0.0–1.0)
pH: 5.5 (ref 5.0–8.0)

## 2013-03-05 LAB — BASIC METABOLIC PANEL
BUN: 30 mg/dL — AB (ref 6–23)
CHLORIDE: 102 meq/L (ref 96–112)
CO2: 23 mEq/L (ref 19–32)
Calcium: 10.1 mg/dL (ref 8.4–10.5)
Creatinine, Ser: 1.83 mg/dL — ABNORMAL HIGH (ref 0.50–1.35)
GFR calc Af Amer: 40 mL/min — ABNORMAL LOW (ref >90)
GFR, EST NON AFRICAN AMERICAN: 35 mL/min — AB (ref >90)
GLUCOSE: 124 mg/dL — AB (ref 70–99)
POTASSIUM: 5 meq/L (ref 3.7–5.3)
SODIUM: 137 meq/L (ref 137–147)

## 2013-03-05 LAB — SURGICAL PCR SCREEN
MRSA, PCR: POSITIVE — AB
Staphylococcus aureus: POSITIVE — AB

## 2013-03-05 LAB — PROTIME-INR
INR: 1.01 (ref 0.00–1.49)
PROTHROMBIN TIME: 13.1 s (ref 11.6–15.2)

## 2013-03-05 LAB — ABO/RH: ABO/RH(D): O NEG

## 2013-03-05 NOTE — Progress Notes (Signed)
03-05-13 1415 Please note labs viewable in Epic. 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 Pt. Needs RX. For Mupirocin called to [facility] and pt. Is aware this will result in Contact Isolation during stay. W. Floy Sabina

## 2013-03-05 NOTE — Patient Instructions (Signed)
[place]  03/05/2013   Your procedure is scheduled on: 03/11/13  Report to [facility] at 5:30 AM.  Call this number if you have problems the morning of surgery 336-: [phone]   Remember:   Do not eat food or drink liquids After Midnight.     Take these medicines the morning of surgery with A SIP OF WATER: levothyroxin   Do not wear jewelry, make-up or nail polish.  Do not wear lotions, powders, or perfumes. You may wear deodorant.  Do not shave 48 hours prior to surgery. Men may shave face and neck.  Do not bring valuables to the hospital.  Contacts, dentures or bridgework may not be worn into surgery.  Leave suitcase in the car. After surgery it may be brought to your room.  For patients admitted to the hospital, checkout time is 11:00 AM the day of discharge.    Please read over the following fact sheets that you were given: MRSA Information, incentive spirometry fact sheet, blood fact sheet Paulette Blanch, RN  pre op nurse call if needed [phone]    FAILURE TO [place]   Patient Signature: ___________________________________________

## 2013-03-08 ENCOUNTER — Other Ambulatory Visit: Payer: Self-pay | Admitting: Orthopedic Surgery

## 2013-03-08 NOTE — Progress Notes (Signed)
Surgery clearance note 01/06/13 Corine Shelter on chart, EKG 12/01/12 on chart, LOV note 07/28/12 Dr. Marval Regal on chart, lab results from 11/2012 on chart

## 2013-03-09 NOTE — Progress Notes (Signed)
Abnormal Chest x-ray faxed to Dr. Tonita Cong via [facility]

## 2013-03-11 ENCOUNTER — Encounter (HOSPITAL_COMMUNITY): Admission: RE | Payer: Self-pay | Source: Ambulatory Visit

## 2013-03-11 ENCOUNTER — Inpatient Hospital Stay (HOSPITAL_COMMUNITY): Admission: RE | Admit: 2013-03-11 | Payer: Medicare Other | Source: Ambulatory Visit | Admitting: Specialist

## 2013-03-11 LAB — TYPE AND SCREEN
ABO/RH(D): O NEG
ANTIBODY SCREEN: NEGATIVE

## 2013-03-11 SURGERY — ARTHROPLASTY, KNEE, TOTAL
Anesthesia: Choice | Site: Knee | Laterality: Left

## 2013-03-25 ENCOUNTER — Other Ambulatory Visit: Payer: Self-pay | Admitting: Orthopedic Surgery

## 2013-04-12 ENCOUNTER — Other Ambulatory Visit: Payer: Self-pay | Admitting: Orthopedic Surgery

## 2013-04-12 NOTE — H&P (Signed)
[name] DOB: [birth year] Male  Chief Complaint: Left knee pain  History of Present Illness The patient is a 73 year old male who comes in today for a preoperative History and Physical. The patient is scheduled for a left total knee arthroplasty to be performed by Dr. Johnn Hai, MD at [facility] on 04/22/2013. Pt with many years of B/L knee pain, more consistently the left within the last several months. He was last seen in November and discussed TKA with Dr. Tonita Cong, had an injection in the right knee at that time. The right knee is actually doing pretty well right now, less severe than the left. He occasionally wears his brace for support. He has modified his activity to prevent worsening pain. He does note stiffness. He has had to cut back on regular activities and pain is limiting ADL's at this point. Refractory to steroid injections which had previously been helpful.  Dr. Tonita Cong and the patient mutually agreed to proceed with a total knee replacement. Risks and benefits of the procedure were discussed including stiffness, suboptimal range of motion, persistent pain, infection requiring removal of prosthesis and reinsertion, need for prophylactic antibiotics in the future, or example: dental procedures, possible need for manipulation, revision in the future and also anesthetic complications including DVT, PE, etc. We discussed the perioperative course, time in the hospital, postoperative recovery, and the need for elevation to control swelling. We also discussed the predicted range of motion and the probability that squatting and kneeling would be unobtainable in the future. In addition, postoperative anticoagulation was discussed. We have obtained preoperative medical clearance -Dr. Maceo Pro. Provided the patient with illustrated handouts and discussed it in detail. They will enroll in the total joint replacement educational forum at the hospital.  Pt's surgery was initially  scheduled for 1/8 and was postponed due to active pneumonia at the time. He has since completed abx and had a repeat CXR negative for any infiltrates.  Allergies No Known Drug Allergies. 12/01/2012  Family History Cerebrovascular Accident. grandfather fathers side Chronic Obstructive Lung Disease. grandmother fathers side Congestive Heart Failure. grandmother mothers side Heart Disease. grandmother mothers side and grandfather mothers side Hypertension. mother Rheumatoid Arthritis. child Cancer. First Degree Relatives. mother and father  Social History Alcohol use. never consumed alcohol Children. 1 Current work status. working part time Drug/Alcohol Rehab (Currently). no Exercise. Exercises weekly; does running / walking Illicit drug use. no Living situation. live alone Marital status. widowed Most recent primary occupation. Substitute teacher Number of flights of stairs before winded. 4-5 Pain Contract. no Previously in rehab. no Tobacco / smoke exposure. no Tobacco use. Never smoker. never smoker Post-Surgical Plans. rehab- [facility], [facility]. living will  Medication History Levothyroxine Sodium (100MCG Tablet, Oral) Active. Doxazosin Mesylate (4MG  Tablet, Oral) Active. Fenofibrate (134MG  Capsule, Oral) Active. Losartan Potassium (25MG  Tablet, Oral) Active. Oxybutynin Chloride (5MG  Tablet, Oral) Active. Desmopressin Acetate (0.1MG  Tablet, Oral) Active. Aspirin (81MG  Tablet, 1 (one) Oral) Active. Biotin (400MCG Tablet, Oral) Active.  Past Surgical History Arthroscopy of Shoulder. left Colon Polyp Removal - Colonoscopy Other Surgery. Tendon Biceps Repair, Right Arm. Intestinal blockage. Spinal Surgery Tonsillectomy Vasectomy Enucleation of Eyeball-Left. 1974; [place]  Past Medical Hx Cancer. L eye, 1974 Chronic Pain Chronic Renal Failure Syndrome. stage 2 Diabetes Mellitus, Type II. diet controlled High  blood pressure Hypercholesterolemia Hypothyroidism Kidney Stone Prostate Disease Arthritis Impaired Vision Impaired Hearing Tinnitus Pneumonia, January 2015  Review of Systems General:Not Present- Chills, Fever, Night Sweats, Fatigue, Weight Gain,  Weight Loss and Memory Loss. Skin:Not Present- Hives, Itching, Rash, Eczema and Lesions. HEENT:Present- Tinnitus and Hearing Loss. Not Present- Headache, Double Vision, Visual Loss and Dentures. Respiratory:Not Present- Shortness of breath with exertion, Shortness of breath at rest, Allergies, Coughing up blood and Chronic Cough. Cardiovascular:Not Present- Chest Pain, Racing/skipping heartbeats, Difficulty Breathing Lying Down, Murmur, Swelling and Palpitations. Gastrointestinal:Not Present- Bloody Stool, Heartburn, Abdominal Pain, Vomiting, Nausea, Constipation, Diarrhea, Difficulty Swallowing, Jaundice and Loss of appetitie. Male Genitourinary:Present- Urinary frequency and Urinating at Night. Not Present- Blood in Urine, Weak urinary stream, Discharge, Flank Pain, Incontinence, Painful Urination, Urgency and Urinary Retention. Musculoskeletal:Present- Joint Pain. Not Present- Muscle Weakness, Muscle Pain, Joint Swelling, Back Pain, Morning Stiffness and Spasms. Neurological:Not Present- Tremor, Dizziness, Blackout spells, Paralysis, Difficulty with balance and Weakness. Psychiatric:Not Present- Insomnia.  Physical Exam The physical exam findings are as follows:  General Mental Status - Alert, cooperative and good historian. General Appearance- pleasant. Not in acute distress. Orientation- Oriented X3. Build & Nutrition- Well nourished and Well developed.  Head and Neck Head- normocephalic, atraumatic . Neck Global Assessment- supple. no bruit auscultated on the right and no bruit auscultated on the left.  Eye Pupil- Bilateral- Regular and Round. Motion- Bilateral- EOMI.  Chest and Lung  Exam Auscultation: Breath sounds:- clear at anterior chest wall and - clear at posterior chest wall. Adventitious sounds:- No Adventitious sounds.  Cardiovascular Auscultation:Rhythm- Regular rate and rhythm. Heart Sounds- S1 WNL and S2 WNL. Murmurs & Other Heart Sounds:Auscultation of the heart reveals - No Murmurs.  Abdomen Palpation/Percussion:Tenderness- Abdomen is non-tender to palpation. Rigidity (guarding)- Abdomen is soft. Auscultation:Auscultation of the abdomen reveals - Bowel sounds normal.  Male Genitourinary Not done, not pertinent to present illness  Musculoskeletal Left knee, trace effusion. Tender medial and lateral joint line. Patellofemoral pain with compression. ROM 0-110.  Knee exam on inspection reveals no evidence of soft tissue swelling, ecchymosis, deformity, or erythema. Nontender over the fibular head of the peroneal nerve. Nontender over the quadriceps insertion of the patellar ligament insertion. Provocative maneuvers revealed a negative Lachman, negative anterior and posterior drawer, and a negative McMurray's. No instability was noted with varus and valgus stressing at 0 or 30 degrees. On manual motor test the quadriceps and hamstrings were 5/5. Sensory exam was intact to light touch.  Imaging AP standing and lateral demonstrates endstage osteoarthrosis, varus deformity right knee. The left knee slight valgus with severe tricompartmental osteoarthrosis.  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 Tonita Cong on 04/22/13. He has been cleared by his PCP Dr. Maceo Pro, repeat CXR negative for infiltrates, pneumonia resolved. We discussed the surgery itself as well as risks, complications, alternatives including but not limited to DVT, PE, infx, bleeding, failure of procedure, need for secondary procedure, anesthesia risk, even death. Discussed post-op protocols,  need for PT, activity modifications and limitations, DVT and infx ppx. All questions were answered and he desires to proceed as scheduled. Will present to [facility] as scheduled this week for pre-op testing. He will remain NPO after MN prior to surgery. Will hold ASA accordingly. Plan rehab center post-op, xarelto for DVT ppx. He will follow up 10-14 days post-op for staple removal and xrays and will call with any questions or concerns in the interim.  Plan left total knee replacement  Signed electronically by Cecilie Kicks, PA-C for Dr. Tonita Cong

## 2013-04-13 ENCOUNTER — Encounter (HOSPITAL_COMMUNITY): Payer: Self-pay | Admitting: Pharmacy Technician

## 2013-04-14 ENCOUNTER — Other Ambulatory Visit (HOSPITAL_COMMUNITY): Payer: Self-pay | Admitting: Specialist

## 2013-04-14 NOTE — Patient Instructions (Signed)
      Your procedure is scheduled on: 04/22/13  THURSDAY   Report to [place] at  [place]     AM.  Call this number if you have problems the morning of surgery: [phone]       Do not eat food  Or drink :After Midnight. Wednesday NIGHT   Take these medicines the morning of surgery with A SIP OF WATER: Levothyroxine   .  Contacts, dentures or partial plates, or metal hairpins  can not be worn to surgery. Your family will be responsible for glasses, dentures, hearing aides while you are in surgery  Leave suitcase in the car. After surgery it may be brought to your room.  For patients admitted to the hospital, checkout time is 11:00 AM day of  discharge.                DO NOT WEAR JEWELRY, LOTIONS, POWDERS, OR PERFUMES.  WOMEN-- DO NOT SHAVE LEGS OR UNDERARMS FOR 48 HOURS BEFORE SHOWERS. MEN MAY SHAVE FACE.  Patients discharged the day of surgery will not be allowed to drive home. IF going home the day of surgery, you must have a driver and someone to stay with you for the first 24 hours  Name and phone number of your driver:     ADMISSION                                                                   Please read over the following fact sheets that you were given: MRSA Information, Incentive Spirometry Sheet, Blood Transfusion Sheet  Information    [name]  PST [phone]  [phone]                 FAILURE TO [place]                                                  Patient Signature _____________________________

## 2013-04-14 NOTE — Progress Notes (Signed)
Chest x ray report 03/19/13 chart, EKG 9/14 EPIC

## 2013-04-15 ENCOUNTER — Other Ambulatory Visit: Payer: Self-pay | Admitting: Orthopedic Surgery

## 2013-04-15 ENCOUNTER — Encounter (HOSPITAL_COMMUNITY): Payer: Self-pay

## 2013-04-15 ENCOUNTER — Ambulatory Visit (HOSPITAL_COMMUNITY)
Admission: RE | Admit: 2013-04-15 | Discharge: 2013-04-15 | Disposition: A | Discharge status: Home / Self Care | Payer: Medicare Other | Source: Ambulatory Visit | Attending: Orthopedic Surgery | Admitting: Orthopedic Surgery

## 2013-04-15 ENCOUNTER — Encounter (HOSPITAL_COMMUNITY)
Admission: RE | Admit: 2013-04-15 | Discharge: 2013-04-15 | Disposition: A | Discharge status: Home / Self Care | Payer: Medicare Other | Source: Ambulatory Visit | Attending: Specialist | Admitting: Specialist

## 2013-04-15 DIAGNOSIS — Z01812 Encounter for preprocedural laboratory examination: Secondary | ICD-10-CM | POA: Insufficient documentation

## 2013-04-15 DIAGNOSIS — M25469 Effusion, unspecified knee: Secondary | ICD-10-CM | POA: Insufficient documentation

## 2013-04-15 DIAGNOSIS — Z01818 Encounter for other preprocedural examination: Secondary | ICD-10-CM | POA: Insufficient documentation

## 2013-04-15 HISTORY — DX: Pneumonia, unspecified organism: J18.9

## 2013-04-15 LAB — APTT: aPTT: 29 seconds (ref 24–37)

## 2013-04-15 LAB — CBC
HEMATOCRIT: 42.8 % (ref 39.0–52.0)
HEMOGLOBIN: 14.5 g/dL (ref 13.0–17.0)
MCH: 30.1 pg (ref 26.0–34.0)
MCHC: 33.9 g/dL (ref 30.0–36.0)
MCV: 89 fL (ref 78.0–100.0)
Platelets: 177 10*3/uL (ref 150–400)
RBC: 4.81 MIL/uL (ref 4.22–5.81)
RDW: 13.3 % (ref 11.5–15.5)
WBC: 4.6 10*3/uL (ref 4.0–10.5)

## 2013-04-15 LAB — PROTIME-INR
INR: 1 (ref 0.00–1.49)
Prothrombin Time: 13 seconds (ref 11.6–15.2)

## 2013-04-15 LAB — BASIC METABOLIC PANEL
BUN: 26 mg/dL — ABNORMAL HIGH (ref 6–23)
CHLORIDE: 102 meq/L (ref 96–112)
CO2: 23 meq/L (ref 19–32)
Calcium: 9.7 mg/dL (ref 8.4–10.5)
Creatinine, Ser: 1.49 mg/dL — ABNORMAL HIGH (ref 0.50–1.35)
GFR calc Af Amer: 52 mL/min — ABNORMAL LOW (ref >90)
GFR calc non Af Amer: 44 mL/min — ABNORMAL LOW (ref >90)
Glucose, Bld: 141 mg/dL — ABNORMAL HIGH (ref 70–99)
POTASSIUM: 4.6 meq/L (ref 3.7–5.3)
Sodium: 140 mEq/L (ref 137–147)

## 2013-04-15 LAB — SURGICAL PCR SCREEN
MRSA, PCR: POSITIVE — AB
Staphylococcus aureus: POSITIVE — AB

## 2013-04-15 LAB — URINALYSIS, ROUTINE W REFLEX MICROSCOPIC
Bilirubin Urine: NEGATIVE
Glucose, UA: NEGATIVE mg/dL
Hgb urine dipstick: NEGATIVE
KETONES UR: NEGATIVE mg/dL
LEUKOCYTES UA: NEGATIVE
Nitrite: NEGATIVE
PROTEIN: 30 mg/dL — AB
Specific Gravity, Urine: 1.02 (ref 1.005–1.030)
Urobilinogen, UA: 0.2 mg/dL (ref 0.0–1.0)
pH: 5.5 (ref 5.0–8.0)

## 2013-04-15 LAB — URINE MICROSCOPIC-ADD ON

## 2013-04-15 NOTE — Progress Notes (Signed)
Chest x ray repeated at PST appt due to patients request as follow up for pneumonia.  I discussed the fact that a x ray was on chart from 03/19/13. He states cant get rid of cough and does want one today if OK.  Notified patient of POSITIVE PCR SCREEN-- at 4-  States has Mupirocin ointment that he used for a few days left from when he was cancelled. Verified by phone that is still in date, and tip of tube has been touched with clean q tip.  To begin ointment today.. Faxed u/a with micro, PCR report to Dr Tonita Cong via [facility]

## 2013-04-22 ENCOUNTER — Encounter (HOSPITAL_COMMUNITY): Payer: Self-pay | Admitting: *Deleted

## 2013-04-22 ENCOUNTER — Encounter (HOSPITAL_COMMUNITY): Payer: Medicare Other | Admitting: Certified Registered Nurse Anesthetist

## 2013-04-22 ENCOUNTER — Inpatient Hospital Stay (HOSPITAL_COMMUNITY): Payer: Medicare Other | Admitting: Certified Registered Nurse Anesthetist

## 2013-04-22 ENCOUNTER — Inpatient Hospital Stay (HOSPITAL_COMMUNITY): Payer: Medicare Other

## 2013-04-22 ENCOUNTER — Encounter (HOSPITAL_COMMUNITY)
Admission: RE | Disposition: A | Discharge status: Skilled Nursing Facility | Payer: Self-pay | Source: Ambulatory Visit | Attending: Specialist

## 2013-04-22 ENCOUNTER — Inpatient Hospital Stay (HOSPITAL_COMMUNITY)
Admission: RE | Admit: 2013-04-22 | Discharge: 2013-04-26 | DRG: 470 | Disposition: A | Discharge status: Skilled Nursing Facility | Payer: Medicare Other | Source: Ambulatory Visit | Attending: Specialist | Admitting: Specialist

## 2013-04-22 DIAGNOSIS — E785 Hyperlipidemia, unspecified: Secondary | ICD-10-CM | POA: Diagnosis present

## 2013-04-22 DIAGNOSIS — E7800 Pure hypercholesterolemia, unspecified: Secondary | ICD-10-CM | POA: Diagnosis present

## 2013-04-22 DIAGNOSIS — N400 Enlarged prostate without lower urinary tract symptoms: Secondary | ICD-10-CM | POA: Diagnosis present

## 2013-04-22 DIAGNOSIS — Z6831 Body mass index (BMI) 31.0-31.9, adult: Secondary | ICD-10-CM

## 2013-04-22 DIAGNOSIS — H9190 Unspecified hearing loss, unspecified ear: Secondary | ICD-10-CM | POA: Diagnosis present

## 2013-04-22 DIAGNOSIS — M1712 Unilateral primary osteoarthritis, left knee: Secondary | ICD-10-CM

## 2013-04-22 DIAGNOSIS — Z860100 Personal history of colon polyps, unspecified: Secondary | ICD-10-CM

## 2013-04-22 DIAGNOSIS — E119 Type 2 diabetes mellitus without complications: Secondary | ICD-10-CM | POA: Diagnosis present

## 2013-04-22 DIAGNOSIS — N183 Chronic kidney disease, stage 3 unspecified: Secondary | ICD-10-CM | POA: Diagnosis present

## 2013-04-22 DIAGNOSIS — E039 Hypothyroidism, unspecified: Secondary | ICD-10-CM | POA: Diagnosis present

## 2013-04-22 DIAGNOSIS — Z9001 Acquired absence of eye: Secondary | ICD-10-CM

## 2013-04-22 DIAGNOSIS — I10 Essential (primary) hypertension: Secondary | ICD-10-CM

## 2013-04-22 DIAGNOSIS — Z87442 Personal history of urinary calculi: Secondary | ICD-10-CM

## 2013-04-22 DIAGNOSIS — M1710 Unilateral primary osteoarthritis, unspecified knee: Principal | ICD-10-CM | POA: Diagnosis present

## 2013-04-22 DIAGNOSIS — D649 Anemia, unspecified: Secondary | ICD-10-CM | POA: Diagnosis present

## 2013-04-22 DIAGNOSIS — H9319 Tinnitus, unspecified ear: Secondary | ICD-10-CM | POA: Diagnosis present

## 2013-04-22 DIAGNOSIS — Z7982 Long term (current) use of aspirin: Secondary | ICD-10-CM

## 2013-04-22 DIAGNOSIS — H547 Unspecified visual loss: Secondary | ICD-10-CM | POA: Diagnosis present

## 2013-04-22 DIAGNOSIS — I129 Hypertensive chronic kidney disease with stage 1 through stage 4 chronic kidney disease, or unspecified chronic kidney disease: Secondary | ICD-10-CM | POA: Diagnosis present

## 2013-04-22 HISTORY — PX: TOTAL KNEE ARTHROPLASTY: SHX125

## 2013-04-22 LAB — GLUCOSE, CAPILLARY
GLUCOSE-CAPILLARY: 148 mg/dL — AB (ref 70–99)
GLUCOSE-CAPILLARY: 121 mg/dL — AB (ref 70–99)
Glucose-Capillary: 139 mg/dL — ABNORMAL HIGH (ref 70–99)

## 2013-04-22 LAB — TYPE AND SCREEN
ABO/RH(D): O NEG
ANTIBODY SCREEN: NEGATIVE

## 2013-04-22 SURGERY — ARTHROPLASTY, KNEE, TOTAL
Anesthesia: General | Site: Knee | Laterality: Left

## 2013-04-22 MED ORDER — SODIUM CHLORIDE 0.9 % IV SOLN
500.0000 mg | Freq: Once | INTRAVENOUS | Status: AC
  Administered 2013-04-22: 500 mg via INTRAVENOUS
  Filled 2013-04-22: qty 500

## 2013-04-22 MED ORDER — ONDANSETRON HCL 4 MG/2ML IJ SOLN
INTRAMUSCULAR | Status: DC | PRN
  Administered 2013-04-22: 4 mg via INTRAVENOUS

## 2013-04-22 MED ORDER — DOCUSATE SODIUM 100 MG PO CAPS
100.0000 mg | ORAL_CAPSULE | Freq: Two times a day (BID) | ORAL | Status: DC
  Administered 2013-04-22 – 2013-04-26 (×8): 100 mg via ORAL

## 2013-04-22 MED ORDER — OXYBUTYNIN CHLORIDE ER 10 MG PO TB24
10.0000 mg | ORAL_TABLET | Freq: Every day | ORAL | Status: DC
  Administered 2013-04-22 – 2013-04-25 (×4): 10 mg via ORAL
  Filled 2013-04-22 (×5): qty 1

## 2013-04-22 MED ORDER — CEFAZOLIN SODIUM-DEXTROSE 2-3 GM-% IV SOLR
INTRAVENOUS | Status: DC | PRN
  Administered 2013-04-22: 2 g via INTRAVENOUS

## 2013-04-22 MED ORDER — CEFAZOLIN SODIUM-DEXTROSE 2-3 GM-% IV SOLR
2.0000 g | Freq: Four times a day (QID) | INTRAVENOUS | Status: AC
  Administered 2013-04-22 (×2): 2 g via INTRAVENOUS
  Filled 2013-04-22 (×2): qty 50

## 2013-04-22 MED ORDER — FENOFIBRATE 160 MG PO TABS
160.0000 mg | ORAL_TABLET | Freq: Every day | ORAL | Status: DC
  Administered 2013-04-22 – 2013-04-25 (×5): 160 mg via ORAL
  Filled 2013-04-22 (×6): qty 1

## 2013-04-22 MED ORDER — HYDROMORPHONE HCL PF 1 MG/ML IJ SOLN
1.0000 mg | INTRAMUSCULAR | Status: DC | PRN
  Administered 2013-04-22 – 2013-04-23 (×5): 1 mg via INTRAVENOUS
  Filled 2013-04-22 (×5): qty 1

## 2013-04-22 MED ORDER — CEFAZOLIN SODIUM-DEXTROSE 2-3 GM-% IV SOLR
INTRAVENOUS | Status: AC
  Filled 2013-04-22: qty 50

## 2013-04-22 MED ORDER — FENTANYL CITRATE 0.05 MG/ML IJ SOLN
INTRAMUSCULAR | Status: AC
  Filled 2013-04-22: qty 5

## 2013-04-22 MED ORDER — LACTATED RINGERS IV SOLN
INTRAVENOUS | Status: DC
Start: 2013-04-22 — End: 2013-04-22
  Administered 2013-04-22: 09:00:00 via INTRAVENOUS
  Administered 2013-04-22: 1000 mL via INTRAVENOUS
  Administered 2013-04-22 (×2): via INTRAVENOUS

## 2013-04-22 MED ORDER — ONDANSETRON HCL 4 MG PO TABS: 4.0000 mg | ORAL_TABLET | Freq: Four times a day (QID) | ORAL | Status: DC | PRN

## 2013-04-22 MED ORDER — HYDROMORPHONE HCL PF 2 MG/ML IJ SOLN
INTRAMUSCULAR | Status: AC
  Filled 2013-04-22: qty 1

## 2013-04-22 MED ORDER — FENTANYL CITRATE 0.05 MG/ML IJ SOLN
25.0000 ug | INTRAMUSCULAR | Status: DC | PRN
  Administered 2013-04-22: 50 ug via INTRAVENOUS
  Administered 2013-04-22: 25 ug via INTRAVENOUS

## 2013-04-22 MED ORDER — FENTANYL CITRATE 0.05 MG/ML IJ SOLN
INTRAMUSCULAR | Status: DC | PRN
  Administered 2013-04-22 (×2): 100 ug via INTRAVENOUS

## 2013-04-22 MED ORDER — HYDROMORPHONE HCL PF 1 MG/ML IJ SOLN
INTRAMUSCULAR | Status: DC | PRN
  Administered 2013-04-22 (×2): 1 mg via INTRAVENOUS

## 2013-04-22 MED ORDER — PHENYLEPHRINE HCL 10 MG/ML IJ SOLN
INTRAMUSCULAR | Status: DC | PRN
  Administered 2013-04-22 (×2): 80 ug via INTRAVENOUS
  Administered 2013-04-22: 40 ug via INTRAVENOUS

## 2013-04-22 MED ORDER — METHOCARBAMOL 500 MG PO TABS
500.0000 mg | ORAL_TABLET | Freq: Four times a day (QID) | ORAL | Status: DC | PRN
  Administered 2013-04-22 – 2013-04-25 (×10): 500 mg via ORAL
  Filled 2013-04-22 (×10): qty 1

## 2013-04-22 MED ORDER — INSULIN ASPART 100 UNIT/ML ~~LOC~~ SOLN
0.0000 [IU] | Freq: Three times a day (TID) | SUBCUTANEOUS | Status: DC
  Administered 2013-04-23: 2 [IU] via SUBCUTANEOUS
  Administered 2013-04-24 (×2): 3 [IU] via SUBCUTANEOUS
  Administered 2013-04-24: 5 [IU] via SUBCUTANEOUS
  Administered 2013-04-25 (×2): 3 [IU] via SUBCUTANEOUS

## 2013-04-22 MED ORDER — VANCOMYCIN HCL IN DEXTROSE 1-5 GM/200ML-% IV SOLN: 1000.0000 mg | INTRAVENOUS | Status: DC

## 2013-04-22 MED ORDER — MENTHOL 3 MG MT LOZG
1.0000 | LOZENGE | OROMUCOSAL | Status: DC | PRN
  Administered 2013-04-26: 3 mg via ORAL
  Filled 2013-04-22: qty 9

## 2013-04-22 MED ORDER — BUPIVACAINE-EPINEPHRINE PF 0.5-1:200000 % IJ SOLN
INTRAMUSCULAR | Status: AC
  Filled 2013-04-22: qty 30

## 2013-04-22 MED ORDER — PHENYLEPHRINE 40 MCG/ML (10ML) SYRINGE FOR IV PUSH (FOR BLOOD PRESSURE SUPPORT)
PREFILLED_SYRINGE | INTRAVENOUS | Status: AC
  Filled 2013-04-22: qty 10

## 2013-04-22 MED ORDER — METHOCARBAMOL 100 MG/ML IJ SOLN
500.0000 mg | Freq: Four times a day (QID) | INTRAVENOUS | Status: DC | PRN
  Administered 2013-04-22: 500 mg via INTRAVENOUS
  Filled 2013-04-22: qty 5

## 2013-04-22 MED ORDER — PHENOL 1.4 % MT LIQD: 1.0000 | OROMUCOSAL | Status: DC | PRN

## 2013-04-22 MED ORDER — ZOLPIDEM TARTRATE 5 MG PO TABS: 5.0000 mg | ORAL_TABLET | Freq: Every evening | ORAL | Status: DC | PRN

## 2013-04-22 MED ORDER — BUPIVACAINE LIPOSOME 1.3 % IJ SUSP
20.0000 mL | Freq: Once | INTRAMUSCULAR | Status: AC
  Administered 2013-04-22: 20 mL
  Filled 2013-04-22: qty 20

## 2013-04-22 MED ORDER — ONDANSETRON HCL 4 MG/2ML IJ SOLN
INTRAMUSCULAR | Status: AC
  Filled 2013-04-22: qty 2

## 2013-04-22 MED ORDER — HYDROMORPHONE HCL PF 1 MG/ML IJ SOLN
INTRAMUSCULAR | Status: AC
  Filled 2013-04-22: qty 1

## 2013-04-22 MED ORDER — PROPOFOL 10 MG/ML IV BOLUS
INTRAVENOUS | Status: DC | PRN
  Administered 2013-04-22: 200 mg via INTRAVENOUS

## 2013-04-22 MED ORDER — DESMOPRESSIN ACETATE 0.2 MG PO TABS
0.2000 mg | ORAL_TABLET | Freq: Every day | ORAL | Status: DC
  Administered 2013-04-22 – 2013-04-25 (×4): 0.2 mg via ORAL
  Filled 2013-04-22 (×5): qty 1

## 2013-04-22 MED ORDER — MEPERIDINE HCL 50 MG/ML IJ SOLN: 6.2500 mg | INTRAMUSCULAR | Status: DC | PRN

## 2013-04-22 MED ORDER — VANCOMYCIN HCL 10 G IV SOLR
1250.0000 mg | Freq: Once | INTRAVENOUS | Status: AC
  Administered 2013-04-22 (×2): 1250 mg via INTRAVENOUS
  Filled 2013-04-22: qty 1250

## 2013-04-22 MED ORDER — DOCUSATE SODIUM 100 MG PO CAPS: 100.0000 mg | ORAL_CAPSULE | Freq: Two times a day (BID) | ORAL | Status: DC | PRN

## 2013-04-22 MED ORDER — HYDROCODONE-ACETAMINOPHEN 7.5-325 MG PO TABS
1.0000 | ORAL_TABLET | ORAL | Status: DC | PRN
  Administered 2013-04-22 – 2013-04-25 (×14): 2 via ORAL
  Filled 2013-04-22 (×14): qty 2

## 2013-04-22 MED ORDER — FENTANYL CITRATE 0.05 MG/ML IJ SOLN
INTRAMUSCULAR | Status: AC
  Filled 2013-04-22: qty 2

## 2013-04-22 MED ORDER — ROCURONIUM BROMIDE 100 MG/10ML IV SOLN
INTRAVENOUS | Status: DC | PRN
  Administered 2013-04-22: 50 mg via INTRAVENOUS

## 2013-04-22 MED ORDER — LOSARTAN POTASSIUM 25 MG PO TABS
25.0000 mg | ORAL_TABLET | Freq: Every day | ORAL | Status: DC
  Administered 2013-04-22 – 2013-04-23 (×2): 25 mg via ORAL
  Filled 2013-04-22 (×3): qty 1

## 2013-04-22 MED ORDER — DOXAZOSIN MESYLATE 4 MG PO TABS
4.0000 mg | ORAL_TABLET | Freq: Every day | ORAL | Status: DC
  Administered 2013-04-22 – 2013-04-25 (×4): 4 mg via ORAL
  Filled 2013-04-22 (×5): qty 1

## 2013-04-22 MED ORDER — LEVOTHYROXINE SODIUM 100 MCG PO TABS
100.0000 ug | ORAL_TABLET | Freq: Every day | ORAL | Status: DC
  Administered 2013-04-23 – 2013-04-26 (×4): 100 ug via ORAL
  Filled 2013-04-22 (×5): qty 1

## 2013-04-22 MED ORDER — TEMAZEPAM 7.5 MG PO CAPS: 22.5000 mg | ORAL_CAPSULE | Freq: Every evening | ORAL | Status: DC | PRN

## 2013-04-22 MED ORDER — PROPOFOL 10 MG/ML IV BOLUS
INTRAVENOUS | Status: AC
Start: 2013-04-22 — End: 2013-04-22
  Filled 2013-04-22: qty 20

## 2013-04-22 MED ORDER — PROMETHAZINE HCL 25 MG/ML IJ SOLN: 6.2500 mg | INTRAMUSCULAR | Status: DC | PRN

## 2013-04-22 MED ORDER — RIVAROXABAN 10 MG PO TABS: 10.0000 mg | ORAL_TABLET | Freq: Every day | ORAL | Status: DC

## 2013-04-22 MED ORDER — SODIUM CHLORIDE 0.45 % IV SOLN
INTRAVENOUS | Status: AC
  Administered 2013-04-22: 20:00:00 via INTRAVENOUS

## 2013-04-22 MED ORDER — ROCURONIUM BROMIDE 100 MG/10ML IV SOLN
INTRAVENOUS | Status: AC
  Filled 2013-04-22: qty 1

## 2013-04-22 MED ORDER — ACETAMINOPHEN 650 MG RE SUPP: 650.0000 mg | Freq: Four times a day (QID) | RECTAL | Status: DC | PRN

## 2013-04-22 MED ORDER — METOCLOPRAMIDE HCL 10 MG PO TABS: 5.0000 mg | ORAL_TABLET | Freq: Three times a day (TID) | ORAL | Status: DC | PRN

## 2013-04-22 MED ORDER — SODIUM CHLORIDE 0.9 % IR SOLN
Status: DC | PRN
  Administered 2013-04-22: 11:00:00

## 2013-04-22 MED ORDER — OXYCODONE-ACETAMINOPHEN 7.5-325 MG PO TABS: 1.0000 | ORAL_TABLET | ORAL | Status: DC | PRN

## 2013-04-22 MED ORDER — LIDOCAINE HCL (CARDIAC) 20 MG/ML IV SOLN
INTRAVENOUS | Status: DC | PRN
  Administered 2013-04-22: 80 mg via INTRAVENOUS

## 2013-04-22 MED ORDER — RIVAROXABAN 10 MG PO TABS
10.0000 mg | ORAL_TABLET | Freq: Every day | ORAL | Status: DC
  Administered 2013-04-23 – 2013-04-26 (×4): 10 mg via ORAL
  Filled 2013-04-22 (×5): qty 1

## 2013-04-22 MED ORDER — ACETAMINOPHEN 325 MG PO TABS
650.0000 mg | ORAL_TABLET | Freq: Four times a day (QID) | ORAL | Status: DC | PRN
  Administered 2013-04-25 – 2013-04-26 (×3): 650 mg via ORAL
  Filled 2013-04-22 (×3): qty 2

## 2013-04-22 MED ORDER — CHLORHEXIDINE GLUCONATE CLOTH 2 % EX PADS: 6.0000 | MEDICATED_PAD | Freq: Once | CUTANEOUS | Status: DC

## 2013-04-22 MED ORDER — ACETAMINOPHEN 10 MG/ML IV SOLN
1000.0000 mg | Freq: Once | INTRAVENOUS | Status: AC
  Administered 2013-04-22: 1000 mg via INTRAVENOUS
  Filled 2013-04-22: qty 100

## 2013-04-22 MED ORDER — LIP MEDEX EX OINT
TOPICAL_OINTMENT | CUTANEOUS | Status: AC
  Filled 2013-04-22: qty 7

## 2013-04-22 MED ORDER — BUPIVACAINE-EPINEPHRINE (PF) 0.5% -1:200000 IJ SOLN
INTRAMUSCULAR | Status: DC | PRN
  Administered 2013-04-22: 10 mL

## 2013-04-22 MED ORDER — METOCLOPRAMIDE HCL 5 MG/ML IJ SOLN: 5.0000 mg | Freq: Three times a day (TID) | INTRAMUSCULAR | Status: DC | PRN

## 2013-04-22 MED ORDER — LIDOCAINE HCL (CARDIAC) 20 MG/ML IV SOLN
INTRAVENOUS | Status: AC
  Filled 2013-04-22: qty 5

## 2013-04-22 MED ORDER — ONDANSETRON HCL 4 MG/2ML IJ SOLN
4.0000 mg | Freq: Four times a day (QID) | INTRAMUSCULAR | Status: DC | PRN
Start: 2013-04-22 — End: 2013-04-26
  Administered 2013-04-23: 4 mg via INTRAVENOUS
  Filled 2013-04-22: qty 2

## 2013-04-22 SURGICAL SUPPLY — 68 items
BAG ZIPLOCK 12X15 (MISCELLANEOUS) ×3 IMPLANT
BANDAGE ELASTIC 4 VELCRO ST LF (GAUZE/BANDAGES/DRESSINGS) ×3 IMPLANT
BANDAGE ELASTIC 6 VELCRO ST LF (GAUZE/BANDAGES/DRESSINGS) ×3 IMPLANT
BANDAGE ESMARK 6X9 LF (GAUZE/BANDAGES/DRESSINGS) ×1 IMPLANT
BLADE EXTENDED COATED 6.5IN (ELECTRODE) ×3 IMPLANT
BLADE SAG 18X100X1.27 (BLADE) ×3 IMPLANT
BLADE SAW SGTL 13.0X1.19X90.0M (BLADE) ×3 IMPLANT
BNDG ESMARK 6X9 LF (GAUZE/BANDAGES/DRESSINGS) ×3
CAPT RP KNEE ×3 IMPLANT
CEMENT HV SMART SET (Cement) ×6 IMPLANT
CHLORAPREP W/TINT 26ML (MISCELLANEOUS) ×3 IMPLANT
CLOSURE WOUND 1/2 X4 (GAUZE/BANDAGES/DRESSINGS)
CLOTH 2% CHLOROHEXIDINE 3PK (PERSONAL CARE ITEMS) ×3 IMPLANT
CUFF TOURN SGL QUICK 34 (TOURNIQUET CUFF) ×2
CUFF TRNQT CYL 34X4X40X1 (TOURNIQUET CUFF) ×1 IMPLANT
DECANTER SPIKE VIAL GLASS SM (MISCELLANEOUS) IMPLANT
DRAPE LG THREE QUARTER DISP (DRAPES) ×3 IMPLANT
DRAPE ORTHO SPLIT 77X108 STRL (DRAPES) ×4
DRAPE POUCH INSTRU U-SHP 10X18 (DRAPES) ×3 IMPLANT
DRAPE SURG ORHT 6 SPLT 77X108 (DRAPES) ×2 IMPLANT
DRAPE U-SHAPE 47X51 STRL (DRAPES) ×3 IMPLANT
DRSG ADAPTIC 3X8 NADH LF (GAUZE/BANDAGES/DRESSINGS) IMPLANT
DRSG AQUACEL AG ADV 3.5X10 (GAUZE/BANDAGES/DRESSINGS) ×3 IMPLANT
DRSG TEGADERM 4X4.75 (GAUZE/BANDAGES/DRESSINGS) ×3 IMPLANT
DURAPREP 26ML APPLICATOR (WOUND CARE) ×3 IMPLANT
ELECT REM PT RETURN 9FT ADLT (ELECTROSURGICAL) ×3
ELECTRODE REM PT RTRN 9FT ADLT (ELECTROSURGICAL) ×1 IMPLANT
EVACUATOR 1/8 PVC DRAIN (DRAIN) ×3 IMPLANT
FACESHIELD LNG OPTICON STERILE (SAFETY) ×15 IMPLANT
GAUZE SPONGE 2X2 8PLY STRL LF (GAUZE/BANDAGES/DRESSINGS) ×1 IMPLANT
GLOVE BIOGEL PI IND STRL 7.5 (GLOVE) ×1 IMPLANT
GLOVE BIOGEL PI IND STRL 8 (GLOVE) ×1 IMPLANT
GLOVE BIOGEL PI INDICATOR 7.5 (GLOVE) ×2
GLOVE BIOGEL PI INDICATOR 8 (GLOVE) ×2
GLOVE SURG SS PI 7.5 STRL IVOR (GLOVE) ×3 IMPLANT
GLOVE SURG SS PI 8.0 STRL IVOR (GLOVE) ×6 IMPLANT
GOWN STRL REUS W/TWL XL LVL3 (GOWN DISPOSABLE) ×6 IMPLANT
HANDPIECE INTERPULSE COAX TIP (DISPOSABLE) ×2
IMMOBILIZER KNEE 20 (SOFTGOODS) ×3 IMPLANT
KIT BASIN OR (CUSTOM PROCEDURE TRAY) ×3 IMPLANT
MANIFOLD NEPTUNE II (INSTRUMENTS) ×6 IMPLANT
NEEDLE HYPO 22GX1.5 SAFETY (NEEDLE) ×6 IMPLANT
NS IRRIG 1000ML POUR BTL (IV SOLUTION) IMPLANT
PACK TOTAL JOINT (CUSTOM PROCEDURE TRAY) ×3 IMPLANT
PAD ABD 8X10 STRL (GAUZE/BANDAGES/DRESSINGS) IMPLANT
PADDING CAST COTTON 6X4 STRL (CAST SUPPLIES) IMPLANT
POSITIONER SURGICAL ARM (MISCELLANEOUS) ×3 IMPLANT
SET HNDPC FAN SPRY TIP SCT (DISPOSABLE) ×1 IMPLANT
SPONGE GAUZE 2X2 STER 10/PKG (GAUZE/BANDAGES/DRESSINGS) ×2
SPONGE GAUZE 4X4 12PLY (GAUZE/BANDAGES/DRESSINGS) IMPLANT
SPONGE SURGIFOAM ABS GEL 100 (HEMOSTASIS) IMPLANT
STAPLER VISISTAT (STAPLE) IMPLANT
STRIP CLOSURE SKIN 1/2X4 (GAUZE/BANDAGES/DRESSINGS) IMPLANT
SUCTION FRAZIER 12FR DISP (SUCTIONS) ×3 IMPLANT
SUT BONE WAX W31G (SUTURE) IMPLANT
SUT MNCRL AB 4-0 PS2 18 (SUTURE) ×3 IMPLANT
SUT VIC AB 1 CT1 27 (SUTURE) ×4
SUT VIC AB 1 CT1 27XBRD ANTBC (SUTURE) ×2 IMPLANT
SUT VIC AB 2-0 CT1 27 (SUTURE) ×4
SUT VIC AB 2-0 CT1 TAPERPNT 27 (SUTURE) ×2 IMPLANT
SUT VLOC 180 0 24IN GS25 (SUTURE) ×3 IMPLANT
SYR 20CC LL (SYRINGE) ×6 IMPLANT
TOWEL OR 17X26 10 PK STRL BLUE (TOWEL DISPOSABLE) ×3 IMPLANT
TOWER CARTRIDGE SMART MIX (DISPOSABLE) ×3 IMPLANT
TRAY FOLEY CATH 14FRSI W/METER (CATHETERS) ×3 IMPLANT
TRAY FOLEY CATH 16FRSI W/METER (SET/KITS/TRAYS/PACK) ×3 IMPLANT
WATER STERILE IRR 1500ML POUR (IV SOLUTION) ×6 IMPLANT
WRAP KNEE MAXI GEL POST OP (GAUZE/BANDAGES/DRESSINGS) ×3 IMPLANT

## 2013-04-22 NOTE — Progress Notes (Signed)
Clinical Social Work Department CLINICAL SOCIAL WORK PLACEMENT NOTE 04/22/2013  Patient:  RAFIEL, MECCA  Account Number:  [id number] Admit date:  04/22/2013  Clinical Social Worker:  Werner Lean, LCSW  Date/time:  04/22/2013 05:03 PM  Clinical Social Work is seeking post-discharge placement for this patient at the following level of care:   SKILLED NURSING   (*CSW will update this form in Epic as items are completed)     Patient/family provided with [place] Department of Clinical Social Work's list of facilities offering this level of care within the geographic area requested by the patient (or if unable, by the patient's family).  04/22/2013  Patient/family informed of their freedom to choose among providers that offer the needed level of care, that participate in Medicare, Medicaid or managed care program needed by the patient, have an available bed and are willing to accept the patient.    Patient/family informed of [facility]' ownership interest in [facility], as well as of the fact that they are under no obligation to receive care at this facility.  PASARR submitted to EDS on 04/22/2013 PASARR number received from EDS on 04/22/2013  FL2 transmitted to all facilities in geographic area requested by pt/family on  04/22/2013 FL2 transmitted to all facilities within larger geographic area on   Patient informed that his/her managed care company has contracts with or will negotiate with  certain facilities, including the following:     Patient/family informed of bed offers received:  04/22/2013 Patient chooses bed at [facility] Physician recommends and patient chooses bed at    Patient to be transferred to [place] on   Patient to be transferred to facility by   The following physician request were entered in Epic:   Additional Comments:  Werner Lean LCSW [phone]

## 2013-04-22 NOTE — Interval H&P Note (Signed)
History and Physical Interval Note:  04/22/2013 7:27 AM  [name]  has presented today for surgery, with the diagnosis of Left Knee DJD  The various methods of treatment have been discussed with the patient and family. After consideration of risks, benefits and other options for treatment, the patient has consented to  Procedure(s): LEFT TOTAL KNEE ARTHROPLASTY (Left) as a surgical intervention .  The patient's history has been reviewed, patient examined, no change in status, stable for surgery.  I have reviewed the patient's chart and labs.  Questions were answered to the patient's satisfaction.     Earon Rivest C

## 2013-04-22 NOTE — H&P (View-Only) (Signed)
[name] DOB: 04/06/1940 Male  Chief Complaint: Left knee pain  History of Present Illness The patient is a 72 year old male who comes in today for a preoperative History and Physical. The patient is scheduled for a left total knee arthroplasty to be performed by Dr. Jeffrey C. Beane, MD at [facility] on 04/22/2013. Pt with many years of B/L knee pain, more consistently the left within the last several months. He was last seen in November and discussed TKA with Dr. Beane, had an injection in the right knee at that time. The right knee is actually doing pretty well right now, less severe than the left. He occasionally wears his brace for support. He has modified his activity to prevent worsening pain. He does note stiffness. He has had to cut back on regular activities and pain is limiting ADL's at this point. Refractory to steroid injections which had previously been helpful.  Dr. Beane and the patient mutually agreed to proceed with a total knee replacement. Risks and benefits of the procedure were discussed including stiffness, suboptimal range of motion, persistent pain, infection requiring removal of prosthesis and reinsertion, need for prophylactic antibiotics in the future, or example: dental procedures, possible need for manipulation, revision in the future and also anesthetic complications including DVT, PE, etc. We discussed the perioperative course, time in the hospital, postoperative recovery, and the need for elevation to control swelling. We also discussed the predicted range of motion and the probability that squatting and kneeling would be unobtainable in the future. In addition, postoperative anticoagulation was discussed. We have obtained preoperative medical clearance -Dr. Fried. Provided the patient with illustrated handouts and discussed it in detail. They will enroll in the total joint replacement educational forum at the hospital.  Pt's surgery was initially  scheduled for 1/8 and was postponed due to active pneumonia at the time. He has since completed abx and had a repeat CXR negative for any infiltrates.  Allergies No Known Drug Allergies. 12/01/2012  Family History Cerebrovascular Accident. grandfather fathers side Chronic Obstructive Lung Disease. grandmother fathers side Congestive Heart Failure. grandmother mothers side Heart Disease. grandmother mothers side and grandfather mothers side Hypertension. mother Rheumatoid Arthritis. child Cancer. First Degree Relatives. mother and father  Social History Alcohol use. never consumed alcohol Children. 1 Current work status. working part time Drug/Alcohol Rehab (Currently). no Exercise. Exercises weekly; does running / walking Illicit drug use. no Living situation. live alone Marital status. widowed Most recent primary occupation. Substitute teacher Number of flights of stairs before winded. 4-5 Pain Contract. no Previously in rehab. no Tobacco / smoke exposure. no Tobacco use. Never smoker. never smoker Post-Surgical Plans. rehab- [facility], [place] [employer] Directives. living will  Medication History Levothyroxine Sodium (100MCG Tablet, Oral) Active. Doxazosin Mesylate (4MG Tablet, Oral) Active. Fenofibrate (134MG Capsule, Oral) Active. Losartan Potassium (25MG Tablet, Oral) Active. Oxybutynin Chloride (5MG Tablet, Oral) Active. Desmopressin Acetate (0.1MG Tablet, Oral) Active. Aspirin (81MG Tablet, 1 (one) Oral) Active. Biotin (400MCG Tablet, Oral) Active.  Past Surgical History Arthroscopy of Shoulder. left Colon Polyp Removal - Colonoscopy Other Surgery. Tendon Biceps Repair, Right Arm. Intestinal blockage. Spinal Surgery Tonsillectomy Vasectomy Enucleation of Eyeball-Left. 1974; [place]  Past Medical Hx Cancer. L eye, 1974 Chronic Pain Chronic Renal Failure Syndrome. stage 2 Diabetes Mellitus, Type II. diet controlled High  blood pressure Hypercholesterolemia Hypothyroidism Kidney Stone Prostate Disease Arthritis Impaired Vision Impaired Hearing Tinnitus Pneumonia, January 2015  Review of Systems General:Not Present- Chills, Fever, Night Sweats, Fatigue, Weight Gain,   Weight Loss and Memory Loss. Skin:Not Present- Hives, Itching, Rash, Eczema and Lesions. HEENT:Present- Tinnitus and Hearing Loss. Not Present- Headache, Double Vision, Visual Loss and Dentures. Respiratory:Not Present- Shortness of breath with exertion, Shortness of breath at rest, Allergies, Coughing up blood and Chronic Cough. Cardiovascular:Not Present- Chest Pain, Racing/skipping heartbeats, Difficulty Breathing Lying Down, Murmur, Swelling and Palpitations. Gastrointestinal:Not Present- Bloody Stool, Heartburn, Abdominal Pain, Vomiting, Nausea, Constipation, Diarrhea, Difficulty Swallowing, Jaundice and Loss of appetitie. Male Genitourinary:Present- Urinary frequency and Urinating at Night. Not Present- Blood in Urine, Weak urinary stream, Discharge, Flank Pain, Incontinence, Painful Urination, Urgency and Urinary Retention. Musculoskeletal:Present- Joint Pain. Not Present- Muscle Weakness, Muscle Pain, Joint Swelling, Back Pain, Morning Stiffness and Spasms. Neurological:Not Present- Tremor, Dizziness, Blackout spells, Paralysis, Difficulty with balance and Weakness. Psychiatric:Not Present- Insomnia.  Physical Exam The physical exam findings are as follows:  General Mental Status - Alert, cooperative and good historian. General Appearance- pleasant. Not in acute distress. Orientation- Oriented X3. Build & Nutrition- Well nourished and Well developed.  Head and Neck Head- normocephalic, atraumatic . Neck Global Assessment- supple. no bruit auscultated on the right and no bruit auscultated on the left.  Eye Pupil- Bilateral- Regular and Round. Motion- Bilateral- EOMI.  Chest and Lung  Exam Auscultation: Breath sounds:- clear at anterior chest wall and - clear at posterior chest wall. Adventitious sounds:- No Adventitious sounds.  Cardiovascular Auscultation:Rhythm- Regular rate and rhythm. Heart Sounds- S1 WNL and S2 WNL. Murmurs & Other Heart Sounds:Auscultation of the heart reveals - No Murmurs.  Abdomen Palpation/Percussion:Tenderness- Abdomen is non-tender to palpation. Rigidity (guarding)- Abdomen is soft. Auscultation:Auscultation of the abdomen reveals - Bowel sounds normal.  Male Genitourinary Not done, not pertinent to present illness  Musculoskeletal Left knee, trace effusion. Tender medial and lateral joint line. Patellofemoral pain with compression. ROM 0-110.  Knee exam on inspection reveals no evidence of soft tissue swelling, ecchymosis, deformity, or erythema. Nontender over the fibular head of the peroneal nerve. Nontender over the quadriceps insertion of the patellar ligament insertion. Provocative maneuvers revealed a negative Lachman, negative anterior and posterior drawer, and a negative McMurray's. No instability was noted with varus and valgus stressing at 0 or 30 degrees. On manual motor test the quadriceps and hamstrings were 5/5. Sensory exam was intact to light touch.  Imaging AP standing and lateral demonstrates endstage osteoarthrosis, varus deformity right knee. The left knee slight valgus with severe tricompartmental osteoarthrosis.  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 Tonita Cong on 04/22/13. He has been cleared by his PCP Dr. Maceo Pro, repeat CXR negative for infiltrates, pneumonia resolved. We discussed the surgery itself as well as risks, complications, alternatives including but not limited to DVT, PE, infx, bleeding, failure of procedure, need for secondary procedure, anesthesia risk, even death. Discussed post-op protocols,  need for PT, activity modifications and limitations, DVT and infx ppx. All questions were answered and he desires to proceed as scheduled. Will present to [facility] as scheduled this week for pre-op testing. He will remain NPO after MN prior to surgery. Will hold ASA accordingly. Plan rehab center post-op, xarelto for DVT ppx. He will follow up 10-14 days post-op for staple removal and xrays and will call with any questions or concerns in the interim.  Plan left total knee replacement  Signed electronically by Cecilie Kicks, PA-C for Dr. Tonita Cong

## 2013-04-22 NOTE — Anesthesia Postprocedure Evaluation (Signed)
  Anesthesia Post-op Note  Patient: [name]  Procedure(s) Performed: Procedure(s) (LRB): LEFT TOTAL KNEE ARTHROPLASTY (Left)  Patient Location: PACU  Anesthesia Type: General  Level of Consciousness: awake and alert   Airway and Oxygen Therapy: Patient Spontanous Breathing  Post-op Pain: mild  Post-op Assessment: Post-op Vital signs reviewed, Patient's Cardiovascular Status Stable, Respiratory Function Stable, Patent Airway and No signs of Nausea or vomiting  Last Vitals:  Filed Vitals:   04/22/13 1655  BP: 140/80  Pulse: 70  Temp: 36.4 C  Resp: 18    Post-op Vital Signs: stable   Complications: No apparent anesthesia complications

## 2013-04-22 NOTE — Anesthesia Preprocedure Evaluation (Addendum)
Anesthesia Evaluation  Patient identified by MRN, date of birth, ID band Patient awake    Reviewed: Allergy & Precautions, H&P , NPO status , Patient's Chart, lab work & pertinent test results  Airway Mallampati: II TM Distance: >3 FB Neck ROM: Full    Dental no notable dental hx.    Pulmonary neg pulmonary ROS,  breath sounds clear to auscultation  Pulmonary exam normal       Cardiovascular hypertension, Pt. on medications Rhythm:Regular Rate:Normal     Neuro/Psych negative neurological ROS  negative psych ROS   GI/Hepatic negative GI ROS, Neg liver ROS,   Endo/Other  diabetes  Renal/GU Renal InsufficiencyRenal disease  negative genitourinary   Musculoskeletal negative musculoskeletal ROS (+)   Abdominal   Peds negative pediatric ROS (+)  Hematology negative hematology ROS (+)   Anesthesia Other Findings   Reproductive/Obstetrics negative OB ROS                           Anesthesia Physical  Anesthesia Plan  ASA: II  Anesthesia Plan: General   Post-op Pain Management:    Induction: Intravenous  Airway Management Planned: Oral ETT  Additional Equipment:   Intra-op Plan:   Post-operative Plan: Extubation in OR  Informed Consent: I have reviewed the patients History and Physical, chart, labs and discussed the procedure including the risks, benefits and alternatives for the proposed anesthesia with the patient or authorized representative who has indicated his/her understanding and acceptance.   Dental advisory given  Plan Discussed with: CRNA  Anesthesia Plan Comments:         Anesthesia Quick Evaluation  

## 2013-04-22 NOTE — Brief Op Note (Signed)
04/22/2013  11:31 AM  PATIENT:  [name]  73 y.o. male  PRE-OPERATIVE DIAGNOSIS:  Left Knee DJD  POST-OPERATIVE DIAGNOSIS:  Left Knee DJD  PROCEDURE:  Procedure(s): LEFT TOTAL KNEE ARTHROPLASTY (Left)  SURGEON:  Surgeon(s) and Role:    * Johnn Hai, MD - Primary  PHYSICIAN ASSISTANT:   ASSISTANTS: Bissell   ANESTHESIA:   general  EBL:  Total I/O In: 2000 [I.V.:2000] Out: 300 [Urine:300]  BLOOD ADMINISTERED:none  DRAINS: none   LOCAL MEDICATIONS USED:  MARCAINE     SPECIMEN:  No Specimen  DISPOSITION OF SPECIMEN:  N/A  COUNTS:  YES  TOURNIQUET:  * Missing tourniquet times found for documented tourniquets in log:  [id number] *  DICTATION: .Other Dictation: Dictation Number   [id number]  PLAN OF CARE: Admit to inpatient   PATIENT DISPOSITION:  PACU - hemodynamically stable.   Delay start of Pharmacological VTE agent (>24hrs) due to surgical blood loss or risk of bleeding: no

## 2013-04-22 NOTE — Transfer of Care (Deleted)
Immediate Anesthesia Transfer of Care Note  Patient: [name]  Procedure(s) Performed: Procedure(s): LEFT TOTAL KNEE ARTHROPLASTY (Left)  Patient Location: PACU  Anesthesia Type:General  Level of Consciousness: awake and alert   Airway & Oxygen Therapy: Patient Spontanous Breathing and Patient connected to face mask oxygen  Post-op Assessment: Report given to PACU RN and Post -op Vital signs reviewed and stable  Post vital signs: Reviewed and stable  Complications: No apparent anesthesia complications 

## 2013-04-22 NOTE — Transfer of Care (Signed)
Immediate Anesthesia Transfer of Care Note  Patient: [name]  Procedure(s) Performed: Procedure(s): LEFT TOTAL KNEE ARTHROPLASTY (Left)  Patient Location: PACU  Anesthesia Type:General  Level of Consciousness: awake and alert   Airway & Oxygen Therapy: Patient Spontanous Breathing and Patient connected to face mask oxygen  Post-op Assessment: Report given to PACU RN and Post -op Vital signs reviewed and stable  Post vital signs: Reviewed and stable  Complications: No apparent anesthesia complications

## 2013-04-22 NOTE — Progress Notes (Signed)
Utilization review completed.  

## 2013-04-22 NOTE — Progress Notes (Signed)
ANTIBIOTIC CONSULT NOTE - INITIAL  Pharmacy Consult for vancomycin Indication: Peri-operative prophylaxis for L TKA 04/22/13  No Known Allergies  Patient Measurements:   From 04/15/13: Ht  178 cm Wt  99.3 kg  Vital Signs: Temp: 98.2 F (36.8 C) (02/19 1300) Temp src: Oral (02/19 0651) BP: 133/75 mmHg (02/19 1315) Pulse Rate: 72 (02/19 1315) Intake/Output from previous day:   Intake/Output from this shift: Total I/O In: 2800 [I.V.:2800] Out: 300 [Urine:300]  Labs: From 04/15/13: SCr 1.49, estimated CrCl ~ 44 mL/min/72kg     Microbiology: Recent Results (from the past 720 hour(s))  SURGICAL PCR SCREEN     Status: Abnormal   Collection Time    04/15/13 11:05 AM      Result Value Ref Range Status   MRSA, 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   Staphylococcus aureus POSITIVE (*) NEGATIVE Final   Comment:            The Xpert SA Assay (FDA     approved for NASAL specimens     in patients over 25 years of age),     is one component of     a comprehensive surveillance     program.  Test performance has     been validated by [employer] for patients greater     than or equal to 11 year old.     It is not intended     to diagnose infection nor to     guide or monitor treatment.     RESULT CALLED TO, READ BACK BY AND VERIFIED WITH:     MICHELL IN PADM AT 1400 ON 2.12.15 BY SHEAW    Medical History: Past Medical History  Diagnosis Date  . Type II diabetes mellitus, well controlled     with diet and exercise  . Hypertension, essential, benign   . Dyslipidemia     On fenofibrate and fish oil  . Hypothyroidism   . BPH (benign prostatic hyperplasia)      followed by Dr. Risa Grill  . CKD (chronic kidney disease) stage 3, GFR 30-59 ml/min      Dr. Arty Baumgartner  . Arthritis   . Tinnitus   . History of kidney stones 15 years ago  . Tendonitis of elbow, right   .  Hearing loss   . 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 Hx of colonic polyps   . Hernia     from 2006 surgery   . Staph infection 4 years ago  . Pneumonia 12/14-1/15    04/15/13-continued cough, non productive, no fever    Medications:  Scheduled:  . Chlorhexidine Gluconate Cloth  6 each Topical Once  . fentaNYL      . vancomycin  500 mg Intravenous Once   Infusions:  . lactated ringers 1,000 mL (04/22/13 0800)   PRN: fentaNYL, meperidine (DEMEROL) injection, methocarbamol (ROBAXIN) IV, methocarbamol, promethazine  Assessment: 73 y/o M underwent L TKA today.  Preoperative nasal swab was positive for MRSA by PCR.  Preoperative antibiotic administered was vancomycin 1250mg  IV x 1 today at 0800hr.  Hx of CKD noted.   Goal of Therapy:  Prevention of perioperative infection Adequate antimicrobial levels at time of surgery and for 24 hours post-op.  Plan:  Vancomycin 500 mg IV x 1 dose tonight at  2000hr, then discontinue.  Clayburn Pert, PharmD, BCPS Pager: [phone] 04/22/2013  1:41 PM

## 2013-04-22 NOTE — Progress Notes (Signed)
Clinical Social Work Department BRIEF PSYCHOSOCIAL ASSESSMENT 04/22/2013  Patient:  [name], [name]     Account Number:  [id number]     Admit date:  04/22/2013  Clinical Social Worker:  Lacie Scotts  Date/Time:  04/22/2013 05:00 PM  Referred by:  Physician  Date Referred:  04/22/2013 Referred for  SNF Placement   Other Referral:   Interview type:  Patient Other interview type:    PSYCHOSOCIAL DATA Living Status:  ALONE Admitted from facility:   Level of care:   Primary support name:  Rodell Perna Primary support relationship to patient:  CHILD, ADULT Degree of support available:   supportive    CURRENT CONCERNS Current Concerns  Post-Acute Placement   Other Concerns:    SOCIAL WORK ASSESSMENT / PLAN Pt is a 73 yr old gentleman living at home prior to hospitalization. CSW met with pt / daughter to assist with d/c planning. Pt has made prior arrangements to have ST Rehab at [facility] following hospital d/c. CSW has contacted SNF and plan has been confirmed. SNF is expecting pt on Monday if stable for d/c. CSW will continue to follow to assist with d/c planning to SNF.   Assessment/plan status:  Psychosocial Support/Ongoing Assessment of Needs Other assessment/ plan:   Information/referral to community resources:   Insurance coverage for SNF and ambulance transport reviewed.    PATIENT'S/FAMILY'S RESPONSE TO PLAN OF CARE: Pt is sore from his recent surgery. He is hopeful that he'll been feeling better in the near future. Pt is looking forward to having rehab at [facility].   Werner Lean LCSW [phone]

## 2013-04-23 LAB — BASIC METABOLIC PANEL
BUN: 20 mg/dL (ref 6–23)
CO2: 23 meq/L (ref 19–32)
Calcium: 8.3 mg/dL — ABNORMAL LOW (ref 8.4–10.5)
Chloride: 98 mEq/L (ref 96–112)
Creatinine, Ser: 1.57 mg/dL — ABNORMAL HIGH (ref 0.50–1.35)
GFR calc Af Amer: 48 mL/min — ABNORMAL LOW (ref >90)
GFR, EST NON AFRICAN AMERICAN: 42 mL/min — AB (ref >90)
Glucose, Bld: 153 mg/dL — ABNORMAL HIGH (ref 70–99)
Potassium: 4.2 mEq/L (ref 3.7–5.3)
SODIUM: 134 meq/L — AB (ref 137–147)

## 2013-04-23 LAB — GLUCOSE, CAPILLARY
GLUCOSE-CAPILLARY: 140 mg/dL — AB (ref 70–99)
Glucose-Capillary: 131 mg/dL — ABNORMAL HIGH (ref 70–99)
Glucose-Capillary: 137 mg/dL — ABNORMAL HIGH (ref 70–99)
Glucose-Capillary: 144 mg/dL — ABNORMAL HIGH (ref 70–99)
Glucose-Capillary: 191 mg/dL — ABNORMAL HIGH (ref 70–99)

## 2013-04-23 LAB — CBC
HCT: 38.3 % — ABNORMAL LOW (ref 39.0–52.0)
Hemoglobin: 12.6 g/dL — ABNORMAL LOW (ref 13.0–17.0)
MCH: 29.7 pg (ref 26.0–34.0)
MCHC: 32.9 g/dL (ref 30.0–36.0)
MCV: 90.3 fL (ref 78.0–100.0)
PLATELETS: 171 10*3/uL (ref 150–400)
RBC: 4.24 MIL/uL (ref 4.22–5.81)
RDW: 13.5 % (ref 11.5–15.5)
WBC: 9.7 10*3/uL (ref 4.0–10.5)

## 2013-04-23 NOTE — Evaluation (Signed)
Physical Therapy Evaluation Patient Details Name: [name] MRN: [id number] DOB: Sep 02, 1940 Today's Date: 04/23/2013 Time: 4098-1191 PT Time Calculation (min): 32 min  PT Assessment / Plan / Recommendation History of Present Illness     Clinical Impression  Pt s/p L TKR presents with decreased L LE strength/ROM and post op pain limiting functional mobility.  Pt would benefit from follow up rehab at SNF level to maximize IND and safety for d/c home with ltd assist    PT Assessment  Patient needs continued PT services    Follow Up Recommendations  SNF    Does the patient have the potential to tolerate intense rehabilitation      Barriers to Discharge        Equipment Recommendations  None recommended by PT    Recommendations for Other Services OT consult   Frequency 7X/week    Precautions / Restrictions Precautions Precautions: Knee;Fall Required Braces or Orthoses: Knee Immobilizer - Left Knee Immobilizer - Left: Discontinue once straight leg raise with < 10 degree lag Restrictions Weight Bearing Restrictions: No Other Position/Activity Restrictions: WBAT   Pertinent Vitals/Pain 6/10; premed, ice packs provided      Mobility  Bed Mobility Overal bed mobility: Needs Assistance Bed Mobility: Supine to Sit Supine to sit: Mod assist;+2 for physical assistance General bed mobility comments: cues for sequence and use of R LE to self assist - pt ltd by abd hernia Transfers Overall transfer level: Needs assistance Equipment used: Rolling walker (2 wheeled) Transfers: Sit to/from Stand Sit to Stand: +2 physical assistance;Min assist;Mod assist General transfer comment: cues for LE management and use of UEs to self assist Ambulation/Gait Ambulation/Gait assistance: +2 physical assistance;Min assist Ambulation Distance (Feet): 28 Feet Assistive device: Rolling walker (2 wheeled) Gait Pattern/deviations: Step-to pattern;Decreased step length - right;Decreased step  length - left;Shuffle;Trunk flexed General Gait Details: Cues for sequence, posture and position from RW    Exercises Total Joint Exercises Ankle Circles/Pumps: AROM;Both;15 reps;Supine Quad Sets: AROM;Both;10 reps;Supine Heel Slides: AAROM;Left;10 reps;Supine Straight Leg Raises: AAROM;Left;10 reps;Supine   PT Diagnosis: Difficulty walking  PT Problem List: Decreased strength;Decreased range of motion;Decreased activity tolerance;Decreased mobility;Decreased knowledge of use of DME;Pain PT Treatment Interventions: DME instruction;Gait training;Stair training;Functional mobility training;Therapeutic activities;Therapeutic exercise;Patient/family education     PT Goals(Current goals can be found in the care plan section) Acute Rehab PT Goals Patient Stated Goal: Get back to taking long walks PT Goal Formulation: With patient Time For Goal Achievement: 04/30/13 Potential to Achieve Goals: Good  Visit Information  Last PT Received On: 04/23/13 Assistance Needed: +2       Prior Functioning  Home Living Family/patient expects to be discharged to:: Skilled nursing facility Living Arrangements: Alone Prior Function Level of Independence: Independent Communication Communication: HOH Dominant Hand: Right    Cognition  Cognition Arousal/Alertness: Awake/alert Behavior During Therapy: WFL for tasks assessed/performed Overall Cognitive Status: Within Functional Limits for tasks assessed    Extremity/Trunk Assessment Upper Extremity Assessment Upper Extremity Assessment: Overall WFL for tasks assessed Lower Extremity Assessment Lower Extremity Assessment: LLE deficits/detail LLE Deficits / Details: 2+/5 quads with AAROM at knee - 10 - 45 Cervical / Trunk Assessment Cervical / Trunk Assessment: Normal   Balance    End of Session PT - End of Session Equipment Utilized During Treatment: Gait belt;Left knee immobilizer Activity Tolerance: Patient tolerated treatment well Patient  left: in chair;with call [place]/phone within reach;with family/visitor present Nurse Communication: Mobility status  GP     Shelina Luo 04/23/2013, 12:11 PM

## 2013-04-23 NOTE — Evaluation (Signed)
Occupational Therapy Evaluation Patient Details Name: [name] MRN: [id number] DOB: 05/29/1940 Today's Date: 04/23/2013 Time: 1207-1242 OT Time Calculation (min): 35 min  OT Assessment / Plan / Recommendation History of present illness Pt is s/p L TKA   Clinical Impression   Pt doing well and is very motivated. He will benefit from continued OT education to improve ADL independence for next venue of care.    OT Assessment  Patient needs continued OT Services    Follow Up Recommendations  SNF;Supervision/Assistance - 24 hour    Barriers to Discharge      Equipment Recommendations  None recommended by OT    Recommendations for Other Services    Frequency  Min 2X/week    Precautions / Restrictions Precautions Precautions: Knee;Fall Required Braces or Orthoses: Knee Immobilizer - Left Knee Immobilizer - Left: Discontinue once straight leg raise with < 10 degree lag Restrictions Weight Bearing Restrictions: No Other Position/Activity Restrictions: WBAT   Pertinent Vitals/Pain 10/10 at start of session 6/10 at end of session; reposition, ice    ADL  Eating/Feeding: Simulated;Independent Where Assessed - Eating/Feeding: Chair Grooming: Simulated;Wash/dry hands;Set up Where Assessed - Grooming: Unsupported sitting Upper Body Bathing: Simulated;Chest;Right arm;Left arm;Abdomen;Set up Where Assessed - Upper Body Bathing: Unsupported sitting Lower Body Bathing: Simulated;Moderate assistance Where Assessed - Lower Body Bathing: Supported sit to stand Upper Body Dressing: Simulated;Minimal assistance Where Assessed - Upper Body Dressing: Unsupported sitting Lower Body Dressing: Simulated;Moderate assistance;Maximal assistance Where Assessed - Lower Body Dressing: Supported sit to stand Toilet Transfer: Performed;Minimal assistance;Moderate assistance Toilet Transfer Equipment: Raised toilet seat with arms (or 3-in-1 over toilet) Toileting - Clothing Manipulation and  Hygiene: Simulated;Moderate assistance Where Assessed - Toileting Clothing Manipulation and Hygiene: Sit to stand from 3-in-1 or toilet Equipment Used: Knee Immobilizer;Gait belt;Rolling walker ADL Comments: Pt states he felt better after functional transfer  to bathroom /3in1. Pain was at 10/10 at start of session and reports it came down 6/10 after session. Pt needed reinforcement with hand placement and walker safety especially to not let go of walker before fully backing up to 3in1. He also needed a cue to not steer walker with only 1 hand.     OT Diagnosis: Generalized weakness  OT Problem List: Decreased strength;Decreased knowledge of use of DME or AE OT Treatment Interventions: Self-care/ADL training;DME and/or AE instruction;Therapeutic activities;Patient/family education   OT Goals(Current goals can be found in the care plan section) Acute Rehab OT Goals Patient Stated Goal: return to independence OT Goal Formulation: With patient Time For Goal Achievement: 04/30/13 Potential to Achieve Goals: Good  Visit Information  Last OT Received On: 04/23/13 Assistance Needed: +1 History of Present Illness: Pt is s/p L TKA       Prior Functioning     Home Living Family/patient expects to be discharged to:: Skilled nursing facility Living Arrangements: Alone Prior Function Level of Independence: Independent Communication Communication: HOH Dominant Hand: Right         Vision/Perception     Cognition  Cognition Arousal/Alertness: Awake/alert Behavior During Therapy: WFL for tasks assessed/performed Overall Cognitive Status: Within Functional Limits for tasks assessed    Extremity/Trunk Assessment Upper Extremity Assessment Upper Extremity Assessment: Overall WFL for tasks assessed  Cervical / Trunk Assessment Cervical / Trunk Assessment: Normal     Mobility  Transfers Overall transfer level: Needs assistance Equipment used: Rolling walker (2 wheeled) Transfers:  Sit to/from Stand Sit to Stand: Min assist;Mod assist General transfer comment: verbal cues for hand placement and LE management.  Balance     End of Session OT - End of Session Equipment Utilized During Treatment: Rolling walker;Left knee immobilizer Activity Tolerance: Patient tolerated treatment well Patient left: in chair;with call [place]/phone within reach  [place], [place] [phone] 04/23/2013, 1:08 PM

## 2013-04-23 NOTE — Progress Notes (Signed)
Physical Therapy Treatment Patient Details Name: [name] MRN: [id number] DOB: August 25, 1940 Today's Date: 04/23/2013 Time: 6812-7517 PT Time Calculation (min): 17 min  PT Assessment / Plan / Recommendation  History of Present Illness Pt is s/p L TKA   PT Comments     Follow Up Recommendations  SNF     Does the patient have the potential to tolerate intense rehabilitation     Barriers to Discharge        Equipment Recommendations  None recommended by PT    Recommendations for Other Services OT consult  Frequency 7X/week   Progress towards PT Goals Progress towards PT goals: Progressing toward goals  Plan Current plan remains appropriate    Precautions / Restrictions Precautions Precautions: Knee;Fall Required Braces or Orthoses: Knee Immobilizer - Left Knee Immobilizer - Left: Discontinue once straight leg raise with < 10 degree lag Restrictions Weight Bearing Restrictions: No Other Position/Activity Restrictions: WBAT   Pertinent Vitals/Pain 4-5/10; RN aware, ice packs provided    Mobility  Bed Mobility Overal bed mobility: Needs Assistance Bed Mobility: Sit to Supine Sit to supine: Mod assist General bed mobility comments: cues for sequence and use of R LE to self assist - pt ltd by abd hernia Transfers Overall transfer level: Needs assistance Equipment used: Rolling walker (2 wheeled) Transfers: Sit to/from Stand Sit to Stand: Min assist;Mod assist General transfer comment: verbal cues for hand placement and LE management.  Ambulation/Gait Ambulation/Gait assistance: Min assist;Mod assist Ambulation Distance (Feet): 56 Feet Assistive device: Rolling walker (2 wheeled) Gait Pattern/deviations: Step-to pattern;Shuffle;Antalgic;Decreased step length - right;Decreased step length - left;Trunk flexed General Gait Details: Cues for sequence, posture and position from RW    Exercises     PT Diagnosis:    PT Problem List:   PT Treatment Interventions:      PT Goals (current goals can now be found in the care plan section) Acute Rehab PT Goals Patient Stated Goal: return to independence PT Goal Formulation: With patient Time For Goal Achievement: 04/30/13 Potential to Achieve Goals: Good  Visit Information  Last PT Received On: 04/23/13 Assistance Needed: +1 History of Present Illness: Pt is s/p L TKA    Subjective Data  Patient Stated Goal: return to independence   Cognition  Cognition Arousal/Alertness: Awake/alert Behavior During Therapy: WFL for tasks assessed/performed Overall Cognitive Status: Within Functional Limits for tasks assessed    Balance     End of Session PT - End of Session Equipment Utilized During Treatment: Gait belt;Left knee immobilizer Activity Tolerance: Patient tolerated treatment well Patient left: in bed;with call [place]/phone within reach;with family/visitor present Nurse Communication: Mobility status CPM Left Knee CPM Left Knee: On   GP     [name] 04/23/2013, 4:57 PM

## 2013-04-23 NOTE — Discharge Instructions (Signed)
Elevate leg above heart 6x a day for 20minutes each °Use knee immobilizer while walking until can SLR x 10 °Use knee immobilizer in bed to keep knee in extension °Aquacel dressing may remain in place for 7 days. May shower with aquacel dressing in place. After 7 days, remove aquacel dressing. Do not remove steri-strips if they are present. Place new dressing with gauze and tape or ACE bandage which should be kept clean and dry and changed daily. °============================================== °Information on my medicine - XARELTO® (Rivaroxaban) ° °This medication education was reviewed with me or my healthcare representative as part of my discharge preparation.  The pharmacist that spoke with me during my hospital stay was:  Akaila Rambo K, RPH ° °Why was Xarelto® prescribed for you? °Xarelto® was prescribed for you to reduce the risk of blood clots forming after orthopedic surgery. The medical term for these abnormal blood clots is venous thromboembolism (VTE). ° °What do you need to know about xarelto® ? °Take your Xarelto® ONCE DAILY at the same time every day. °You may take it either with or without food. ° °If you have difficulty swallowing the tablet whole, you may crush it and mix in applesauce just prior to taking your dose. ° °Take Xarelto® exactly as prescribed by your doctor and DO NOT stop taking Xarelto® without talking to the doctor who prescribed the medication.  Stopping without other VTE prevention medication to take the place of Xarelto® may increase your risk of developing a clot. ° °After discharge, you should have regular check-up appointments with your healthcare provider that is prescribing your Xarelto®.   ° °What do you do if you miss a dose? °If you miss a dose, take it as soon as you remember on the same day then continue your regularly scheduled once daily regimen the next day. Do not take two doses of Xarelto® on the same day.  ° °Important Safety Information °A possible side effect  of Xarelto® is bleeding. You should call your healthcare provider right away if you experience any of the following: °  Bleeding from an injury or your nose that does not stop. °  Unusual colored urine (red or dark brown) or unusual colored stools (red or black). °  Unusual bruising for unknown reasons. °  A serious fall or if you hit your head (even if there is no bleeding). ° °Some medicines may interact with Xarelto® and might increase your risk of bleeding while on Xarelto®. To help avoid this, consult your healthcare provider or pharmacist prior to using any new prescription or non-prescription medications, including herbals, vitamins, non-steroidal anti-inflammatory drugs (NSAIDs) and supplements. ° °This website has more information on Xarelto®: www.xarelto.com. ° ° °

## 2013-04-23 NOTE — Op Note (Signed)
NAMEZARED, KNOTH NO.:  [id number]  MEDICAL RECORD NO.:  [id number]  LOCATION:  23                         FACILITY:  [facility]  PHYSICIAN:  Susa Day, M.D.    DATE OF BIRTH:  [birth year]  DATE OF PROCEDURE:  04/22/2013 DATE OF DISCHARGE:                              OPERATIVE REPORT   PREOPERATIVE DIAGNOSES:  End-stage osteoarthrosis, varus deformity of the left knee.  PREOPERATIVE DIAGNOSES:  End-stage osteoarthrosis, varus deformity of the left knee.  PROCEDURE PERFORMED:  Left total knee arthroplasty.  ANESTHESIA:  General.  COMPONENTS:  Five rotating platform, DePuy, femur, 5 tibia, 10-mm insert, 38 patella.  HISTORY:  A 73 year old, end-stage osteoarthrosis of the knee, bone on bone, disabling, failing conservative treatments, and severely negatively impacting his activities of daily living.  He was indicated for replacement of the degenerated joint.  Risks and benefits were discussed including bleeding, infection, damage to neurovascular structures, DVT, PE, anesthetic complications, etc.  TECHNIQUE:  With the patient in supine position, after induction of adequate general anesthesia, 1500 mg of vancomycin, 2 g of Kefzol, left lower extremity was prepped and draped and exsanguinated in usual sterile fashion.  Thigh tourniquet was inflated to 300 mmHg.  Midline incision was then made over the patella.  Full-thickness flaps were developed.  Median parapatellar arthrotomy was performed.  Patella was everted.  Soft tissues were elevated medially.  MCL preserved.  Knee was flexed.  Tricompartmental osteoarthrosis bone-on-bone was noted. Osteophytes were removed with a rongeur.  Remnants of the medial and lateral menisci and ACL were removed as well.  Step drill was utilized to enter the femoral canal, it was irrigated.  A 5-degree intramedullary guide placed, 10 off the distal femur was utilized, oscillating saw performed this cut.  Then,  attention turned towards sizing the femur, sized off the anterior cortex to a 5, 10 and 3 degrees of external rotation.  Anterior, posterior and chamfer cuts were then performed with an oscillating saw.  Attention was turned towards the tibia, and subluxed with Meckel's.  External alignment guide utilized, 2 off the defect, which was posteromedial.  This was then pinned parallel to the tibia bisecting the tibiotalar joint.  Appropriate slope cut performed. We then used the extension and flexion blocks that were both tight.  We therefore took an additional 2 mm off the tibia and then flexion- extension gaps were equivalent at 10.  Next, we cauterized the geniculates, removed any remnants of menisci posteriorly, sized the tibia to a 5, maximizing the coverage just to the medial aspect of tibial tubercle that was pinned, drilled centrally and a thin guide placed.  We then turned our attention towards completing the femur.  Box guide was then placed bisecting the intercondylar notch, pinned and cut performed.  We then used a trial femur and tibia, 10 mm insert.  We had full extension, full flexion, good stability of varus and valgus, stressing 0-30 degrees.  We then turned our attention towards the patella, was measured at 25 for 38.  Removed 9.5 with a new patellar planer guide.  This was performed without difficulty.  We drilled our PEG holes.  Three of them medializing  and then placed a trial patella. We reduced it and excellent patellofemoral tracking was noted.  We removed all instrumentation.  A loose osteophyte was noted posterolateral, this was removed.  Copiously irrigated with pulsatile lavage.  Flexed the knee, all surfaces were dried, mixed the cement at the back table, was injected into the tibial canal digitally pressurizing it.  We then impacted the 10 insert tray.  Redundant cement, impacted it.  Cemented the femur, impacted that as well.  Placed a trial 10, reduced it and  held an axial load throughout the curing of the cement.  Redundant cement removed.  Cemented the patella as well. After curing of the cement, redundant cement removed.  We had excellent range with full extension and full flexion, good stability with varus and valgus stressing of 0-30 degrees and excellent patellofemoral tracking.  The trial was removed, checked posteriorly.  We meticulously removed the cement, copiously irrigated the wound, subluxed the tibia, placed 10 permanent insert, reduced.  Again, found to be stable throughout a full range, which was 0-140, and excellent patellofemoral tracking.  Wound was copiously irrigated.  We then placed a Hemovac, brought out through a lateral stab wound in the skin.  We anesthetized the periosteum on the distal femur with Marcaine.  Reapproximation sutures in slight flexion of the patellar arthrotomy with 1 Vicryl, then a running V-Loc.  Following that, we had full extension, full flexion, good stability, varus and valgus stressing of 0-30 degrees and excellent patellofemoral tracking.  We then placed the 2-0 and then skin was reapproximated with staples, had flexion of the gravity.  Irrigation was done throughout the case periodically with pulsatile lavage and antibiotic irrigation.  Wound was dressed sterilely.  Tourniquet was deflated and there was adequate revascularization of lower extremity appreciated.  The patient tolerated the procedure well.  No complications.  Assistant, Cleophas Dunker, [place], which was utilized for holding of the extremity, and assistance and implantation.     Susa Day, M.D.     Geralynn Rile  D:  04/22/2013  T:  04/23/2013  Job:  [id number]

## 2013-04-23 NOTE — Progress Notes (Signed)
Patient ID: [name], male   DOB: [birth year], 73 y.o.   MRN: [id number] Subjective: 1 Day Post-Op Procedure(s) (LRB): LEFT TOTAL KNEE ARTHROPLASTY (Left) Patient reports pain as moderate.    Patient has complaints of left knee pain  We will start therapy today. Plan is to go to SNF after hospital stay.  Objective: Vital signs in last 24 hours: Temp:  [97.5 F (36.4 C)-99.3 F (37.4 C)] 99 F (37.2 C) (02/20 0518) Pulse Rate:  [60-84] 81 (02/20 0518) Resp:  [9-20] 20 (02/20 0518) BP: (120-140)/(55-85) 120/55 mmHg (02/20 0518) SpO2:  [91 %-100 %] 91 % (02/20 0518) Weight:  [99.3 kg (218 lb 14.7 oz)] 99.3 kg (218 lb 14.7 oz) (02/19 1215)  Intake/Output from previous day:  Intake/Output Summary (Last 24 hours) at 04/23/13 0808 Last data filed at 04/23/13 0555  Gross per 24 hour  Intake   3675 ml  Output   1862 ml  Net   1813 ml    Intake/Output this shift:    Labs: Results for orders placed during the hospital encounter of 04/22/13  GLUCOSE, CAPILLARY      Result Value Ref Range   Glucose-Capillary 139 (*) 70 - 99 mg/dL   Comment 1 Documented in Chart    GLUCOSE, CAPILLARY      Result Value Ref Range   Glucose-Capillary 148 (*) 70 - 99 mg/dL  CBC      Result Value Ref Range   WBC 9.7  4.0 - 10.5 K/uL   RBC 4.24  4.22 - 5.81 MIL/uL   Hemoglobin 12.6 (*) 13.0 - 17.0 g/dL   HCT 38.3 (*) 39.0 - 52.0 %   MCV 90.3  78.0 - 100.0 fL   MCH 29.7  26.0 - 34.0 pg   MCHC 32.9  30.0 - 36.0 g/dL   RDW 13.5  11.5 - 15.5 %   Platelets 171  150 - 400 K/uL  BASIC METABOLIC PANEL      Result Value Ref Range   Sodium 134 (*) 137 - 147 mEq/L   Potassium 4.2  3.7 - 5.3 mEq/L   Chloride 98  96 - 112 mEq/L   CO2 23  19 - 32 mEq/L   Glucose, Bld 153 (*) 70 - 99 mg/dL   BUN 20  6 - 23 mg/dL   Creatinine, Ser 1.57 (*) 0.50 - 1.35 mg/dL   Calcium 8.3 (*) 8.4 - 10.5 mg/dL   GFR calc non Af Amer 42 (*) >90 mL/min   GFR calc Af Amer 48 (*) >90 mL/min  GLUCOSE, CAPILLARY      Result  Value Ref Range   Glucose-Capillary 121 (*) 70 - 99 mg/dL   Comment 1 Documented in Chart     Comment 2 Notify RN      Exam - Neurologically intact ABD soft Neurovascular intact Sensation intact distally Intact pulses distally Dorsiflexion/Plantar flexion intact Incision: dressing C/D/I and aquacel dressing clean and dry, drain dressing saturated No cellulitis present Compartment soft no calf pain or sign of DVT Motor function intact - moving foot and toes well on exam.  Hemovac pulled without difficulty tip intact  Assessment/Plan: 1 Day Post-Op Procedure(s) (LRB): LEFT TOTAL KNEE ARTHROPLASTY (Left)  Advance diet Up with therapy D/C IV fluids Past Medical History  Diagnosis Date  . Type II diabetes mellitus, well controlled     with diet and exercise  . Hypertension, essential, benign   . Dyslipidemia     On fenofibrate and fish oil  .  Hypothyroidism   . BPH (benign prostatic hyperplasia)      followed by Dr. Risa Grill  . CKD (chronic kidney disease) stage 3, GFR 30-59 ml/min      Dr. Arty Baumgartner  . Arthritis   . Tinnitus   . History of kidney stones 15 years ago  . Tendonitis of elbow, right   . Hearing loss   . 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 Hx of colonic polyps   . Hernia     from 2006 surgery   . Staph infection 4 years ago  . Pneumonia 12/14-1/15    04/15/13-continued cough, non productive, no fever    DVT Prophylaxis - Xarelto Protocol Weight-Bearing as tolerated to left leg Keep foley until tomorrow. No vaccines. Plan D/C to SNF Monday Drain site dressing changed Discussed with Dr. Mliss Fritz, [place]. 04/23/2013, 8:08 AM

## 2013-04-24 DIAGNOSIS — N183 Chronic kidney disease, stage 3 unspecified: Secondary | ICD-10-CM

## 2013-04-24 DIAGNOSIS — I10 Essential (primary) hypertension: Secondary | ICD-10-CM

## 2013-04-24 DIAGNOSIS — E119 Type 2 diabetes mellitus without complications: Secondary | ICD-10-CM

## 2013-04-24 LAB — GLUCOSE, CAPILLARY
Glucose-Capillary: 208 mg/dL — ABNORMAL HIGH (ref 70–99)
Glucose-Capillary: 161 mg/dL — ABNORMAL HIGH (ref 70–99)
Glucose-Capillary: 189 mg/dL — ABNORMAL HIGH (ref 70–99)
Glucose-Capillary: 173 mg/dL — ABNORMAL HIGH (ref 70–99)

## 2013-04-24 LAB — BASIC METABOLIC PANEL
BUN: 27 mg/dL — AB (ref 6–23)
CHLORIDE: 98 meq/L (ref 96–112)
CO2: 24 meq/L (ref 19–32)
Calcium: 8.4 mg/dL (ref 8.4–10.5)
Creatinine, Ser: 1.95 mg/dL — ABNORMAL HIGH (ref 0.50–1.35)
GFR calc Af Amer: 37 mL/min — ABNORMAL LOW (ref >90)
GFR calc non Af Amer: 32 mL/min — ABNORMAL LOW (ref >90)
Glucose, Bld: 171 mg/dL — ABNORMAL HIGH (ref 70–99)
Potassium: 4.2 mEq/L (ref 3.7–5.3)
Sodium: 134 mEq/L — ABNORMAL LOW (ref 137–147)

## 2013-04-24 LAB — CBC
HEMATOCRIT: 34.1 % — AB (ref 39.0–52.0)
HEMOGLOBIN: 11.2 g/dL — AB (ref 13.0–17.0)
MCH: 29.6 pg (ref 26.0–34.0)
MCHC: 32.8 g/dL (ref 30.0–36.0)
MCV: 90 fL (ref 78.0–100.0)
Platelets: 149 10*3/uL — ABNORMAL LOW (ref 150–400)
RBC: 3.79 MIL/uL — AB (ref 4.22–5.81)
RDW: 13.5 % (ref 11.5–15.5)
WBC: 9.1 10*3/uL (ref 4.0–10.5)

## 2013-04-24 LAB — URINALYSIS, ROUTINE W REFLEX MICROSCOPIC
Bilirubin Urine: NEGATIVE
GLUCOSE, UA: NEGATIVE mg/dL
Hgb urine dipstick: NEGATIVE
Ketones, ur: NEGATIVE mg/dL
LEUKOCYTES UA: NEGATIVE
NITRITE: NEGATIVE
PROTEIN: NEGATIVE mg/dL
Specific Gravity, Urine: 1.025 (ref 1.005–1.030)
UROBILINOGEN UA: 0.2 mg/dL (ref 0.0–1.0)
pH: 5 (ref 5.0–8.0)

## 2013-04-24 MED ORDER — METOCLOPRAMIDE HCL 10 MG PO TABS: 5.0000 mg | ORAL_TABLET | Freq: Three times a day (TID) | ORAL | Status: DC | PRN

## 2013-04-24 MED ORDER — METOCLOPRAMIDE HCL 5 MG/ML IJ SOLN: 5.0000 mg | Freq: Three times a day (TID) | INTRAMUSCULAR | Status: DC | PRN

## 2013-04-24 MED ORDER — SODIUM CHLORIDE 0.9 % IV SOLN
INTRAVENOUS | Status: DC
  Administered 2013-04-24 – 2013-04-26 (×3): via INTRAVENOUS

## 2013-04-24 NOTE — Progress Notes (Signed)
Physical Therapy Treatment Patient Details Name: [name] MRN: [id number] DOB: 04-01-1940 Today's Date: 04/24/2013 Time: 7829-5621 PT Time Calculation (min): 23 min  PT Assessment / Plan / Recommendation  History of Present Illness Pt is s/p L TKA   PT Comments     Follow Up Recommendations  SNF     Does the patient have the potential to tolerate intense rehabilitation     Barriers to Discharge        Equipment Recommendations  None recommended by PT    Recommendations for Other Services OT consult  Frequency 7X/week   Progress towards PT Goals Progress towards PT goals: Progressing toward goals  Plan Current plan remains appropriate    Precautions / Restrictions Precautions Precautions: Knee;Fall Required Braces or Orthoses: Knee Immobilizer - Left Knee Immobilizer - Left: Discontinue once straight leg raise with < 10 degree lag Restrictions Weight Bearing Restrictions: No Other Position/Activity Restrictions: WBAT   Pertinent Vitals/Pain 5/10; premed, ice packs provided    Mobility  Bed Mobility Overal bed mobility: Needs Assistance Bed Mobility: Sit to Supine Sit to supine: Min assist General bed mobility comments: cues for sequence and use of R LE to self assist - pt ltd by abd hernia Transfers Overall transfer level: Needs assistance Equipment used: Rolling walker (2 wheeled) Transfers: Sit to/from Stand Sit to Stand: Min assist;Mod assist General transfer comment: verbal cues for hand placement and LE management.  Ambulation/Gait Ambulation/Gait assistance: Min assist Ambulation Distance (Feet): 95 Feet Assistive device: Rolling walker (2 wheeled) Gait Pattern/deviations: Step-to pattern;Shuffle;Trunk flexed;Decreased step length - right;Decreased step length - left Gait velocity: decr General Gait Details: Cues for sequence, posture and position from RW    Exercises     PT Diagnosis:    PT Problem List:   PT Treatment Interventions:     PT  Goals (current goals can now be found in the care plan section) Acute Rehab PT Goals Patient Stated Goal: return to independence PT Goal Formulation: With patient Time For Goal Achievement: 04/30/13 Potential to Achieve Goals: Good  Visit Information  Last PT Received On: 04/24/13 Assistance Needed: +1 History of Present Illness: Pt is s/p L TKA    Subjective Data  Patient Stated Goal: return to independence   Cognition  Cognition Arousal/Alertness: Awake/alert Behavior During Therapy: WFL for tasks assessed/performed Overall Cognitive Status: Within Functional Limits for tasks assessed    Balance     End of Session PT - End of Session Equipment Utilized During Treatment: Gait belt;Left knee immobilizer Activity Tolerance: Patient tolerated treatment well Patient left: with call [place]/phone within reach;with family/visitor present;in chair Nurse Communication: Mobility status   GP     Trayce Caravello 04/24/2013, 3:56 PM

## 2013-04-24 NOTE — Progress Notes (Signed)
Subjective: 2 Days Post-Op Procedure(s) (LRB): LEFT TOTAL KNEE ARTHROPLASTY (Left) Patient reports pain as 1 on 0-10 scale.    Objective: Vital signs in last 24 hours: Temp:  [98.3 F (36.8 C)-99.4 F (37.4 C)] 99.4 F (37.4 C) (02/21 0526) Pulse Rate:  [80-88] 80 (02/21 0526) Resp:  [15-18] 18 (02/21 0721) BP: (107-136)/(57-70) 107/57 mmHg (02/21 0526) SpO2:  [91 %-98 %] 93 % (02/21 0526)  Intake/Output from previous day: 02/20 0701 - 02/21 0700 In: 900.8 [I.V.:420.8] Out: 1050 [Urine:1050] Intake/Output this shift:     Recent Labs  04/23/13 0419 04/24/13 0506  HGB 12.6* 11.2*    Recent Labs  04/23/13 0419 04/24/13 0506  WBC 9.7 9.1  RBC 4.24 3.79*  HCT 38.3* 34.1*  PLT 171 149*    Recent Labs  04/23/13 0419 04/24/13 0506  NA 134* 134*  K 4.2 4.2  CL 98 98  CO2 23 24  BUN 20 27*  CREATININE 1.57* 1.95*  GLUCOSE 153* 171*  CALCIUM 8.3* 8.4   No results found for this basename: LABPT, INR,  in the last 72 hours  Neurologically intact  Assessment/Plan: 2 Days Post-Op Procedure(s) (LRB): LEFT TOTAL KNEE ARTHROPLASTY (Left) Advance diet Up with therapy D/C IV fluids Plan for discharge tomorrow or Monday Monitor  Cr gluc Good UO  [name] C 04/24/2013, 7:49 AM

## 2013-04-24 NOTE — Consult Note (Signed)
[facility] Medical Consultation  [name] YQM:[id number] DOB: [birth year] DOA: 04/22/2013 PCP: Abigail Miyamoto, MD   Requesting physician: Dr Tonita Cong Date of consultation: 04/24/13 Reason for consultation: acute on chronic renal insufficiency  Impression/Recommendations Principal Problem:   Left knee DJD acute on chronic renal insufficiency.  Type 2 DIABETES.  Hypertension   1. ACUTE ON CKD: - his baseline creatinine is around 2. - this slight bump could be secondary to volume depletion.  - his bP does appear to be borderline.  - we will start with gentle hydration, stop ace/arb, get UA to evaluate for infection, get urine studies to get FeNa.  - check renal parameters in am.  - if his creatinine continues to rise, we will get US renal to evaluate for hydronephrosis.  - if his bp continues to rise, we will add prn hydralazine to help control BP.     2. Deit controlled DM:  - GET HGBA1C,  - SSI  3. Anemia: - possibly anemia of blood loss.  - continue to monitor.   4. Hypertension: - very well controlled.   5. Left TKA: - further management as per orthopedics.    I will followup again tomorrow. Please contact me if I can be of assistance in the meanwhile. Thank you for this consultation.    HPI:  73 year old gentleman, admitted to the hospital for left TKA , he underwent the procedure on 2/20. On 2/21, he was found to have worsening renal parameters when compared to yesterday. His creatinine on admission isn 1.57 and creatinine today is 1.95. But looking back to the creatinine levels from 2009, he had creatinine upto 2.01 and in January his creatinine was 1.83. We were consulted for evaluation and management fof his possible acute on CKD. Currently he denies any abdominal pain. He reports painful ROM of the left knee. He is currently working with PT.   Review of Systems:  See HPI otherwise negative.   Past Medical History  Diagnosis Date  . Type II  diabetes mellitus, well controlled     with diet and exercise  . Hypertension, essential, benign   . Dyslipidemia     On fenofibrate and fish oil  . Hypothyroidism   . BPH (benign prostatic hyperplasia)      followed by Dr. Risa Grill  . CKD (chronic kidney disease) stage 3, GFR 30-59 ml/min      Dr. Arty Baumgartner  . Arthritis   . Tinnitus   . History of kidney stones 15 years ago  . Tendonitis of elbow, right   . Hearing loss   . 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 Hx of colonic polyps   . Hernia     from 2006 surgery   . Staph infection 4 years ago  . Pneumonia 12/14-1/15    04/15/13-continued cough, non productive, no fever   Past Surgical History  Procedure Laterality Date  . Eye surgery Left 1974    eye removed for eye cancer  . Colonoscopy w/ polypectomy  2012  . Biceps tendon repair Right 2004  . Shoulder arthroscopy Left   . Small intestine surgery  2006    "took everything out and fixed it"  . Tonsillectomy  1970's  . Vasectomy  1975  . Back surgery  1985    lower back, "2 disc removed"  . Total knee arthroplasty Left 04/22/2013    Procedure: LEFT TOTAL KNEE ARTHROPLASTY;  Surgeon: Johnn Hai, MD;  Location: [facility];  Service: Orthopedics;  Laterality: Left;   Social History:  reports that he has never smoked. He has never used smokeless tobacco. He reports that he does not drink alcohol or use illicit drugs.  No Known Allergies History reviewed. No pertinent family history.  Prior to Admission medications   Medication Sig Start Date End Date Taking? Authorizing Provider  desmopressin (DDAVP) 0.2 MG tablet Take 0.2 mg by mouth at bedtime.    Yes Historical Provider, MD  doxazosin (CARDURA) 4 MG tablet Take 4 mg by mouth at bedtime.   Yes Historical Provider, MD  fenofibrate micronized (LOFIBRA) 134 MG capsule Take 134 mg by mouth at bedtime.    Yes Historical Provider, MD  levothyroxine (SYNTHROID, LEVOTHROID) 100 MCG tablet Take 100 mcg by mouth daily  before breakfast.   Yes Historical Provider, MD  losartan (COZAAR) 25 MG tablet Take 25 mg by mouth at bedtime. 12/01/12  Yes Leonie Man, MD  oxybutynin (DITROPAN-XL) 5 MG 24 hr tablet Take 10 mg by mouth at bedtime.    Yes Historical Provider, MD  temazepam (RESTORIL) 22.5 MG capsule Take 22.5 mg by mouth at bedtime as needed for sleep.   Yes Historical Provider, MD  BIOTIN PO Take 1 tablet by mouth at bedtime.     Historical Provider, MD  docusate sodium (COLACE) 100 MG capsule Take 1 capsule (100 mg total) by mouth 2 (two) times daily as needed for mild constipation. 04/22/13   Johnn Hai, MD  oxyCODONE-acetaminophen (PERCOCET) 7.5-325 MG per tablet Take 1-2 tablets by mouth every 4 (four) hours as needed for pain. 04/22/13   Johnn Hai, MD  rivaroxaban (XARELTO) 10 MG TABS tablet Take 1 tablet (10 mg total) by mouth daily. 04/22/13   Johnn Hai, MD   Physical Exam: Blood pressure 107/57, pulse 80, temperature 99.4 F (37.4 C), temperature source Oral, resp. rate 18, height 5\' 10"  (1.778 m), weight 99.3 kg (218 lb 14.7 oz), SpO2 93.00%. Filed Vitals:   04/24/13 0721  BP:   Pulse:   Temp:   Resp: 18     General:  Alert afebrile comfortable  Cardiovascular: s1s2  Respiratory: ctab  Abdomen: soft NT nd bs+  Skin: no rashes  Musculoskeletal: TKA, bandaged.   Neurologic: grossly normal.   Labs on Admission:  Basic Metabolic Panel:  Recent Labs Lab 04/23/13 0419 04/24/13 0506  NA 134* 134*  K 4.2 4.2  CL 98 98  CO2 23 24  GLUCOSE 153* 171*  BUN 20 27*  CREATININE 1.57* 1.95*  CALCIUM 8.3* 8.4   Liver Function Tests: No results found for this basename: AST, ALT, ALKPHOS, BILITOT, PROT, ALBUMIN,  in the last 168 hours No results found for this basename: LIPASE, AMYLASE,  in the last 168 hours No results found for this basename: AMMONIA,  in the last 168 hours CBC:  Recent Labs Lab 04/23/13 0419 04/24/13 0506  WBC 9.7 9.1  HGB 12.6* 11.2*  HCT  38.3* 34.1*  MCV 90.3 90.0  PLT 171 149*   Cardiac Enzymes: No results found for this basename: CKTOTAL, CKMB, CKMBINDEX, TROPONINI,  in the last 168 hours BNP: No components found with this basename: POCBNP,  CBG:  Recent Labs Lab 04/23/13 0718 04/23/13 1217 04/23/13 1739 04/23/13 2125 04/24/13 0753  GLUCAP 137* 144* 140* 191* 208*    Radiological Exams on Admission: X-ray Knee Left Ap And Lateral  04/22/2013   CLINICAL DATA:  Post left knee arthroplasty  EXAM: LEFT KNEE - 1-2 VIEW  COMPARISON:  Portable exam 5638 hr compared to 04/15/2013  FINDINGS: Components of left knee prosthesis newly identified, in expected positions.  No acute fracture, dislocation or bone destruction.  Surgical drain present.  Expected anterior soft tissue changes and skin clips.  No periprosthetic lucency.  Large calcified loose body posterior to the distal femur.  Osseous demineralization.  IMPRESSION: Left knee prosthesis without acute complication.   Electronically Signed   By: Lavonia Dana M.D.   On: 04/22/2013 12:58      Time spent: 55  Minutes.   [facility] Pager [phone]  If 7PM-7AM, please contact night-coverage [email or web address] Password [facility] 04/24/2013, 10:01 AM

## 2013-04-24 NOTE — Progress Notes (Signed)
Physical Therapy Treatment Patient Details Name: [name] MRN: [id number] DOB: [birth year] Today's Date: 04/24/2013 Time: 1132-1217 PT Time Calculation (min): 45 min  PT Assessment / Plan / Recommendation  History of Present Illness Pt is s/p L TKA   PT Comments     Follow Up Recommendations  SNF     Does the patient have the potential to tolerate intense rehabilitation     Barriers to Discharge        Equipment Recommendations  None recommended by PT    Recommendations for Other Services OT consult  Frequency 7X/week   Progress towards PT Goals Progress towards PT goals: Progressing toward goals  Plan Current plan remains appropriate    Precautions / Restrictions Precautions Precautions: Knee;Fall Required Braces or Orthoses: Knee Immobilizer - Left Knee Immobilizer - Left: Discontinue once straight leg raise with < 10 degree lag Restrictions Weight Bearing Restrictions: No Other Position/Activity Restrictions: WBAT   Pertinent Vitals/Pain 5/10; premed, ice packs provided, Meds requested    Mobility  Bed Mobility Overal bed mobility: Needs Assistance Bed Mobility: Supine to Sit Supine to sit: Min assist;Mod assist General bed mobility comments: cues for sequence and use of R LE to self assist - pt ltd by abd hernia Transfers Overall transfer level: Needs assistance Equipment used: Rolling walker (2 wheeled) Transfers: Sit to/from Stand Sit to Stand: Min assist;Mod assist General transfer comment: verbal cues for hand placement and LE management.  Ambulation/Gait Ambulation/Gait assistance: Min assist Ambulation Distance (Feet): 75 Feet (twice) Assistive device: Rolling walker (2 wheeled) Gait Pattern/deviations: Step-to pattern;Step-through pattern;Decreased step length - right;Decreased step length - left;Shuffle;Antalgic;Trunk flexed General Gait Details: Cues for sequence, posture and position from RW    Exercises Total Joint Exercises Ankle  Circles/Pumps: AROM;Both;15 reps;Supine Quad Sets: AROM;Both;Supine;20 reps Heel Slides: AAROM;Left;Supine;20 reps Straight Leg Raises: AAROM;Left;Supine;20 reps Goniometric ROM: L knee AAROM -10 - 60   PT Diagnosis:    PT Problem List:   PT Treatment Interventions:     PT Goals (current goals can now be found in the care plan section) Acute Rehab PT Goals Patient Stated Goal: return to independence PT Goal Formulation: With patient Time For Goal Achievement: 04/30/13 Potential to Achieve Goals: Good  Visit Information  Last PT Received On: 04/24/13 Assistance Needed: +1 History of Present Illness: Pt is s/p L TKA    Subjective Data  Patient Stated Goal: return to independence   Cognition  Cognition Arousal/Alertness: Awake/alert Behavior During Therapy: WFL for tasks assessed/performed Overall Cognitive Status: Within Functional Limits for tasks assessed    Balance     End of Session PT - End of Session Equipment Utilized During Treatment: Gait belt;Left knee immobilizer Activity Tolerance: Patient tolerated treatment well Patient left: with call [place]/phone within reach;with family/visitor present;in chair Nurse Communication: Mobility status   GP     Jode Lippe 04/24/2013, 1:45 PM

## 2013-04-25 ENCOUNTER — Inpatient Hospital Stay (HOSPITAL_COMMUNITY): Payer: Medicare Other

## 2013-04-25 LAB — BASIC METABOLIC PANEL
BUN: 35 mg/dL — ABNORMAL HIGH (ref 6–23)
CALCIUM: 8.3 mg/dL — AB (ref 8.4–10.5)
CO2: 24 mEq/L (ref 19–32)
CREATININE: 2.12 mg/dL — AB (ref 0.50–1.35)
Chloride: 98 mEq/L (ref 96–112)
GFR calc non Af Amer: 29 mL/min — ABNORMAL LOW (ref >90)
GFR, EST AFRICAN AMERICAN: 34 mL/min — AB (ref >90)
Glucose, Bld: 192 mg/dL — ABNORMAL HIGH (ref 70–99)
Potassium: 4 mEq/L (ref 3.7–5.3)
Sodium: 134 mEq/L — ABNORMAL LOW (ref 137–147)

## 2013-04-25 LAB — CBC
HCT: 31.7 % — ABNORMAL LOW (ref 39.0–52.0)
Hemoglobin: 10.9 g/dL — ABNORMAL LOW (ref 13.0–17.0)
MCH: 30.6 pg (ref 26.0–34.0)
MCHC: 34.4 g/dL (ref 30.0–36.0)
MCV: 89 fL (ref 78.0–100.0)
Platelets: 165 10*3/uL (ref 150–400)
RBC: 3.56 MIL/uL — ABNORMAL LOW (ref 4.22–5.81)
RDW: 13.2 % (ref 11.5–15.5)
WBC: 8.3 10*3/uL (ref 4.0–10.5)

## 2013-04-25 LAB — GLUCOSE, CAPILLARY
Glucose-Capillary: 154 mg/dL — ABNORMAL HIGH (ref 70–99)
Glucose-Capillary: 165 mg/dL — ABNORMAL HIGH (ref 70–99)
Glucose-Capillary: 184 mg/dL — ABNORMAL HIGH (ref 70–99)

## 2013-04-25 LAB — CREATININE, URINE, RANDOM: CREATININE, URINE: 286.2 mg/dL

## 2013-04-25 LAB — SODIUM, URINE, RANDOM: Sodium, Ur: 16 mEq/L

## 2013-04-25 MED ORDER — DIPHENHYDRAMINE HCL 25 MG PO CAPS
25.0000 mg | ORAL_CAPSULE | Freq: Four times a day (QID) | ORAL | Status: DC | PRN
  Administered 2013-04-25: 25 mg via ORAL
  Filled 2013-04-25: qty 1

## 2013-04-25 NOTE — Progress Notes (Signed)
Physical Therapy Treatment Patient Details Name: [name] MRN: [id number] DOB: June 12, 1940 Today's Date: 04/25/2013 Time: 1749-4496 PT Time Calculation (min): 33 min  PT Assessment / Plan / Recommendation  History of Present Illness Pt is s/p L TKA   PT Comments   POD # 3 am session.  Applied KI and instructed pt on use for amb.  Assisted OOB to amb in hallway with increased time.  Assisted to BR to void.  Then assisted back to bed for Radiology to perform Ultrasound.   Follow Up Recommendations  SNF     Does the patient have the potential to tolerate intense rehabilitation     Barriers to Discharge        Equipment Recommendations       Recommendations for Other Services    Frequency 7X/week   Progress towards PT Goals Progress towards PT goals: Progressing toward goals  Plan      Precautions / Restrictions Precautions Precautions: Knee;Fall Precaution Comments: Instructed pt ON KI use for amb Required Braces or Orthoses: Knee Immobilizer - Left Knee Immobilizer - Left: Discontinue once straight leg raise with < 10 degree lag Restrictions Weight Bearing Restrictions: No Other Position/Activity Restrictions: WBAT   Pertinent Vitals/Pain C/o 4/10 "not bad"    Mobility  Bed Mobility Overal bed mobility: Needs Assistance Bed Mobility: Supine to Sit Supine to sit: Min assist General bed mobility comments: min assist for L LE and increased time Transfers Overall transfer level: Needs assistance Equipment used: Rolling walker (2 wheeled) Transfers: Sit to/from Stand Sit to Stand: Min assist;Mod assist General transfer comment: verbal cues for hand placement and LE management.  Ambulation/Gait Ambulation/Gait assistance: Min assist Ambulation Distance (Feet): 90 Feet Gait Pattern/deviations: Step-to pattern;Decreased stance time - left;Decreased step length - right Gait velocity: decreased General Gait Details: Cues for sequence, posture and position from RW plus  increased time    PT Goals (current goals can now be found in the care plan section)    Visit Information  Last PT Received On: 04/25/13 Assistance Needed: +1 History of Present Illness: Pt is s/p L TKA    Subjective Data      Cognition       Balance     End of Session PT - End of Session Equipment Utilized During Treatment: Gait belt;Left knee immobilizer Activity Tolerance: Patient tolerated treatment well Patient left: in bed;with call Tess/phone within reach   [name]  PTA [facility]  Acute  Rehab Pager      [phone]

## 2013-04-25 NOTE — Progress Notes (Signed)
Subjective: 3 Days Post-Op Procedure(s) (LRB): LEFT TOTAL KNEE ARTHROPLASTY (Left) Patient doing very well this AM, pain well controlled, progressing well with PT.  Pt c/o pruritis about his trunk.  Pt denies, N/V/F/C, chest pain, SOB, calf pain or paresthesia b/l.  Objective: Vital signs in last 24 hours: Temp:  [99.6 F (37.6 C)-100.2 F (37.9 C)] 99.6 F (37.6 C) (02/22 0542) Pulse Rate:  [78-87] 78 (02/22 0542) Resp:  [16-20] 18 (02/22 0542) BP: (102-113)/(49-61) 109/54 mmHg (02/22 0542) SpO2:  [90 %-96 %] 94 % (02/22 0542)  Intake/Output from previous day: 02/21 0701 - 02/22 0700 In: 2516.3 [P.O.:1440; I.V.:1076.3] Out: 720 [Urine:720] Intake/Output this shift: Total I/O In: 240 [P.O.:240] Out: -    Recent Labs  04/23/13 0419 04/24/13 0506 04/25/13 0526  HGB 12.6* 11.2* 10.9*    Recent Labs  04/24/13 0506 04/25/13 0526  WBC 9.1 8.3  RBC 3.79* 3.56*  HCT 34.1* 31.7*  PLT 149* 165    Recent Labs  04/24/13 0506 04/25/13 0526  NA 134* 134*  K 4.2 4.0  CL 98 98  CO2 24 24  BUN 27* 35*  CREATININE 1.95* 2.12*  GLUCOSE 171* 192*  CALCIUM 8.4 8.3*   No results found for this basename: LABPT, INR,  in the last 72 hours  Incision C/D/I, well approximated with no dehiscence noted.  No erythema/drainage noted.  Normal amounts of post op edema.  Compartments soft, negative Homan's sign.  Lower extremtities are NVI with good motor control of plantar/dorsiflexion of great toes and feet b/l.  No evidence of infection noted.  Assessment/Plan: 3 Days Post-Op Procedure(s) (LRB): LEFT TOTAL KNEE ARTHROPLASTY (Left) Prescribed Benadryl for pt to treat pruritis.  Continue CPT and physical therapy.  Pt to be discharged tomorrow to SNF.  F/u with Dr. Tonita Cong in 2 weeks.  Continue Xarelto for anticoagulation.  FLOWERS, CHRISTOPHER S 04/25/2013, 10:42 AM

## 2013-04-25 NOTE — Progress Notes (Signed)
Physical Therapy Treatment Patient Details Name: [name] MRN: [id number] DOB: Apr 30, 1940 Today's Date: 04/25/2013 Time: 1430-1500 PT Time Calculation (min): 30 min  PT Assessment / Plan / Recommendation  History of Present Illness Pt is s/p L TKA   PT Comments   POD # 3 pm session.  Assisted OOB to amb in hallway second time.  Decreased amb distance due to increased c/o pain.  Assisted back to bed to perform TKR TE's followed by ICE.   Follow Up Recommendations  SNF [facility])     Does the patient have the potential to tolerate intense rehabilitation     Barriers to Discharge        Equipment Recommendations       Recommendations for Other Services    Frequency 7X/week   Progress towards PT Goals Progress towards PT goals: Progressing toward goals  Plan      Precautions / Restrictions Precautions Precautions: Knee;Fall Precaution Comments: Instructed pt ON KI use for amb Required Braces or Orthoses: Knee Immobilizer - Left Knee Immobilizer - Left: Discontinue once straight leg raise with < 10 degree lag Restrictions Weight Bearing Restrictions: No Other Position/Activity Restrictions: WBAT   Pertinent Vitals/Pain C/o 7/10 pain during TE's meds requested ICE applied    Mobility  Bed Mobility Overal bed mobility: Needs Assistance Bed Mobility: Supine to Sit;Sit to Supine Supine to sit: Min assist Sit to supine: Min assist General bed mobility comments: min assist for L LE and increased time Transfers Overall transfer level: Needs assistance Equipment used: Rolling walker (2 wheeled) Transfers: Sit to/from Stand Sit to Stand: Min assist General transfer comment: verbal cues for hand placement and LE management.  Ambulation/Gait Ambulation/Gait assistance: Min assist Ambulation Distance (Feet): 45 Feet Assistive device: Rolling walker (2 wheeled) Gait Pattern/deviations: Step-to pattern;Decreased stance time - left;Decreased step length - right Gait  velocity: decreased General Gait Details: Cues for sequence, posture and position from RW plus increased time    Exercises   Total Knee Replacement TE's 10 reps B LE ankle pumps 10 reps towel squeezes 10 reps knee presses 10 reps heel slides  10 reps SAQ's 10 reps SLR's 10 reps ABD Followed by ICE    PT Goals (current goals can now be found in the care plan section)    Visit Information  Last PT Received On: 04/25/13 Assistance Needed: +1 History of Present Illness: Pt is s/p L TKA    Subjective Data      Cognition       Balance     End of Session PT - End of Session Equipment Utilized During Treatment: Gait belt;Left knee immobilizer Activity Tolerance: Patient tolerated treatment well Patient left: in bed;with call Maule/phone within reach Nurse Communication: Mobility status CPM Left Knee CPM Left Knee: On   [name]  PTA [facility]  Acute  Rehab Pager      [phone]

## 2013-04-25 NOTE — Progress Notes (Signed)
[facility] PROGRESS NOTE  [name] ZOX:[id number] DOB: 12/12/1940 DOA: 04/22/2013 PCP: Abigail Miyamoto, MD  Assessment/Plan:  1. ACUTE ON CKD: - his baseline creatinine is around 2.  - this slight bump could be secondary to volume depletion.  - his bP does appear to be borderline.  -  started with gentle hydration,  Fluids were started after 4 pm yesterday, stopped ace/arb,  UA is negative for infection, FENA is <0.1.  - check renal parameters in am.  - US renal shows medical renal disease, no hydronephrosis.  - I/O last 3 completed shifts: In: 2516.3 [P.O.:1440; I.V.:1076.3] Out: 1320 [Urine:1320] Total I/O In: 240 [P.O.:240] Out: -     2. Deit controlled DM:  -  HGBA1C,  - SSI  3. Anemia:  - possibly anemia of blood loss.  - continue to monitor.  4. Hypertension:  - very well controlled.  5. Left TKA:  - further management as per orthopedics.  I will followup again tomorrow. Please contact me if I can be of assistance in the meanwhile. Thank you for this consultation   Procedures:  TKA  Antibiotics:  none  HPI/Subjective: Comfortable, no new complaints.   Objective: Filed Vitals:   04/25/13 0542  BP: 109/54  Pulse: 78  Temp: 99.6 F (37.6 C)  Resp: 18    Intake/Output Summary (Last 24 hours) at 04/25/13 1033 Last data filed at 04/25/13 8119  Gross per 24 hour  Intake 2276.25 ml  Output    720 ml  Net 1556.25 ml   Filed Weights   04/22/13 1215  Weight: 99.3 kg (218 lb 14.7 oz)    Exam: General: Alert afebrile comfortable  Cardiovascular: s1s2  Respiratory: ctab  Abdomen: soft NT nd bs+  Skin: no rashes  Musculoskeletal: TKA, bandaged . Neurologic: grossly normal.      Data Reviewed: Basic Metabolic Panel:  Recent Labs Lab 04/23/13 0419 04/24/13 0506 04/25/13 0526  NA 134* 134* 134*  K 4.2 4.2 4.0  CL 98 98 98  CO2 23 24 24   GLUCOSE 153* 171* 192*  BUN 20 27* 35*  CREATININE 1.57* 1.95* 2.12*  CALCIUM 8.3* 8.4  8.3*   Liver Function Tests: No results found for this basename: AST, ALT, ALKPHOS, BILITOT, PROT, ALBUMIN,  in the last 168 hours No results found for this basename: LIPASE, AMYLASE,  in the last 168 hours No results found for this basename: AMMONIA,  in the last 168 hours CBC:  Recent Labs Lab 04/23/13 0419 04/24/13 0506 04/25/13 0526  WBC 9.7 9.1 8.3  HGB 12.6* 11.2* 10.9*  HCT 38.3* 34.1* 31.7*  MCV 90.3 90.0 89.0  PLT 171 149* 165   Cardiac Enzymes: No results found for this basename: CKTOTAL, CKMB, CKMBINDEX, TROPONINI,  in the last 168 hours BNP (last 3 results) No results found for this basename: PROBNP,  in the last 8760 hours CBG:  Recent Labs Lab 04/24/13 0753 04/24/13 1154 04/24/13 1704 04/24/13 2207 04/25/13 0712  GLUCAP 208* 161* 189* 173* 154*    Recent Results (from the past 240 hour(s))  SURGICAL PCR SCREEN     Status: Abnormal   Collection Time    04/15/13 11:05 AM      Result Value Ref Range Status   MRSA, 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   Staphylococcus aureus POSITIVE (*) NEGATIVE Final  Comment:            The Xpert SA Assay (FDA     approved for NASAL specimens     in patients over 82 years of age),     is one component of     a comprehensive surveillance     program.  Test performance has     been validated by [employer] for patients greater     than or equal to 56 year old.     It is not intended     to diagnose infection nor to     guide or monitor treatment.     RESULT CALLED TO, READ BACK BY AND VERIFIED WITH:     MICHELL IN PADM AT 1400 ON 2.12.15 BY SHEAW     Studies: No results found.  Scheduled Meds: . desmopressin  0.2 mg Oral QHS  . docusate sodium  100 mg Oral BID  . doxazosin  4 mg Oral QHS  . fenofibrate  160 mg Oral Daily  . insulin aspart  0-15 Units Subcutaneous TID WC  . levothyroxine  100  mcg Oral QAC breakfast  . oxybutynin  10 mg Oral QHS  . rivaroxaban  10 mg Oral Q breakfast   Continuous Infusions: . sodium chloride 75 mL/hr at 04/24/13 1539    Principal Problem:   Left knee DJD    Time spent: 25 min    Blondell Laperle  [facility] Pager [phone]. If 7PM-7AM, please contact night-coverage at [email or web address], password [facility] 04/25/2013, 10:33 AM  LOS: 3 days

## 2013-04-25 NOTE — Progress Notes (Signed)
Pt's daughter at bedside and concerned that father is more drowsy and forgetful. Pt is easily arousable, A&Ox4 with minimal memory deficit. Important to note that pt has received multiple medications within the past 24-48 hours that have a side effect of drowsiness (oxycodone, robaxin, benadryl, etc). Also pt has medical history of decreased kidney function with elevated BUN and Creatinine. Discussed in length with daughter on the telephone previously however she was still concerned and decided to come to stay the night so she would be able to discuss pt condition with physician in am. Vitals and a through assessment have been performed and can be found in flowsheets. It is not felt that physician needs to be contacted at this time especially as lethargy is expected from recent medication regimen. Daughter seems anxious and did comment that she is comparing her father's progress to a family friend who also had a TKR this week. Attempted to reassure family member however she still appears anxious despise verbalizing understanding. Multiple times she expresses that she doesn't think that her father can go "to rehab like this". Will continue to monitor pt however at this time pt is stable and anticipate that he will be more alert with time as medications "wear off"

## 2013-04-26 LAB — BASIC METABOLIC PANEL
BUN: 34 mg/dL — AB (ref 6–23)
CO2: 23 mEq/L (ref 19–32)
Calcium: 8.4 mg/dL (ref 8.4–10.5)
Chloride: 103 mEq/L (ref 96–112)
Creatinine, Ser: 1.81 mg/dL — ABNORMAL HIGH (ref 0.50–1.35)
GFR calc Af Amer: 41 mL/min — ABNORMAL LOW (ref >90)
GFR, EST NON AFRICAN AMERICAN: 35 mL/min — AB (ref >90)
Glucose, Bld: 173 mg/dL — ABNORMAL HIGH (ref 70–99)
POTASSIUM: 4.5 meq/L (ref 3.7–5.3)
SODIUM: 137 meq/L (ref 137–147)

## 2013-04-26 LAB — CBC
HCT: 29 % — ABNORMAL LOW (ref 39.0–52.0)
HEMOGLOBIN: 9.9 g/dL — AB (ref 13.0–17.0)
MCH: 30.6 pg (ref 26.0–34.0)
MCHC: 34.1 g/dL (ref 30.0–36.0)
MCV: 89.5 fL (ref 78.0–100.0)
Platelets: 185 10*3/uL (ref 150–400)
RBC: 3.24 MIL/uL — ABNORMAL LOW (ref 4.22–5.81)
RDW: 13.4 % (ref 11.5–15.5)
WBC: 6.6 10*3/uL (ref 4.0–10.5)

## 2013-04-26 LAB — GLUCOSE, CAPILLARY
GLUCOSE-CAPILLARY: 163 mg/dL — AB (ref 70–99)
Glucose-Capillary: 149 mg/dL — ABNORMAL HIGH (ref 70–99)

## 2013-04-26 NOTE — Discharge Summary (Signed)
Physician Discharge Summary   Patient ID: [name] MRN: [id number] DOB/AGE: [birth year] 73 y.o.  Admit date: 04/22/2013 Discharge date:   Primary Diagnosis: left knee DJD  Admission Diagnoses:  Past Medical History  Diagnosis Date  . Type II diabetes mellitus, well controlled     with diet and exercise  . Hypertension, essential, benign   . Dyslipidemia     On fenofibrate and fish oil  . Hypothyroidism   . BPH (benign prostatic hyperplasia)      followed by Dr. Risa Grill  . CKD (chronic kidney disease) stage 3, GFR 30-59 ml/min      Dr. Arty Baumgartner  . Arthritis   . Tinnitus   . History of kidney stones 15 years ago  . Tendonitis of elbow, right   . Hearing loss   . 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 Hx of colonic polyps   . Hernia     from 2006 surgery   . Staph infection 4 years ago  . Pneumonia 12/14-1/15    04/15/13-continued cough, non productive, no fever   Discharge Diagnoses:   Principal Problem:   Left knee DJD  Estimated body mass index is 31.41 kg/(m^2) as calculated from the following:   Height as of this encounter: 5' 10"  (1.778 m).   Weight as of this encounter: 99.3 kg (218 lb 14.7 oz).  Procedure:  Procedure(s) (LRB): LEFT TOTAL KNEE ARTHROPLASTY (Left)   Consults: hospitalist service for acute on chronic renal insufficiency  HPI: see H&P Laboratory Data: Admission on 04/22/2013  Component Date Value Ref Range Status  . Glucose-Capillary 04/22/2013 139* 70 - 99 mg/dL Final  . Comment 1 04/22/2013 Documented in Chart   Final  . Glucose-Capillary 04/22/2013 148* 70 - 99 mg/dL Final  . WBC 04/23/2013 9.7  4.0 - 10.5 K/uL Final  . RBC 04/23/2013 4.24  4.22 - 5.81 MIL/uL Final  . Hemoglobin 04/23/2013 12.6* 13.0 - 17.0 g/dL Final  . HCT 04/23/2013 38.3* 39.0 - 52.0 % Final  . MCV 04/23/2013 90.3  78.0 - 100.0 fL Final  . MCH 04/23/2013 29.7  26.0 - 34.0 pg Final  . MCHC 04/23/2013 32.9  30.0 - 36.0 g/dL Final  . RDW 04/23/2013 13.5  11.5  - 15.5 % Final  . Platelets 04/23/2013 171  150 - 400 K/uL Final  . Sodium 04/23/2013 134* 137 - 147 mEq/L Final  . Potassium 04/23/2013 4.2  3.7 - 5.3 mEq/L Final  . Chloride 04/23/2013 98  96 - 112 mEq/L Final  . CO2 04/23/2013 23  19 - 32 mEq/L Final  . Glucose, Bld 04/23/2013 153* 70 - 99 mg/dL Final  . BUN 04/23/2013 20  6 - 23 mg/dL Final  . Creatinine, Ser 04/23/2013 1.57* 0.50 - 1.35 mg/dL Final  . Calcium 04/23/2013 8.3* 8.4 - 10.5 mg/dL Final  . GFR calc non Af Amer 04/23/2013 42* >90 mL/min Final  . GFR calc Af Amer 04/23/2013 48* >90 mL/min Final   Comment: (NOTE)                          The eGFR has been calculated using the CKD EPI equation.                          This calculation has not been validated in all clinical situations.  eGFR's persistently <90 mL/min signify possible Chronic Kidney                          Disease.  . Glucose-Capillary 04/22/2013 121* 70 - 99 mg/dL Final  . Comment 1 04/22/2013 Documented in Chart   Final  . Comment 2 04/22/2013 Notify RN   Final  . Glucose-Capillary 04/22/2013 131* 70 - 99 mg/dL Final  . Comment 1 04/22/2013 Documented in Chart   Final  . Comment 2 04/22/2013 Notify RN   Final  . Glucose-Capillary 04/23/2013 137* 70 - 99 mg/dL Final  . Glucose-Capillary 04/23/2013 144* 70 - 99 mg/dL Final  . WBC 04/24/2013 9.1  4.0 - 10.5 K/uL Final  . RBC 04/24/2013 3.79* 4.22 - 5.81 MIL/uL Final  . Hemoglobin 04/24/2013 11.2* 13.0 - 17.0 g/dL Final  . HCT 04/24/2013 34.1* 39.0 - 52.0 % Final  . MCV 04/24/2013 90.0  78.0 - 100.0 fL Final  . MCH 04/24/2013 29.6  26.0 - 34.0 pg Final  . MCHC 04/24/2013 32.8  30.0 - 36.0 g/dL Final  . RDW 04/24/2013 13.5  11.5 - 15.5 % Final  . Platelets 04/24/2013 149* 150 - 400 K/uL Final  . Sodium 04/24/2013 134* 137 - 147 mEq/L Final  . Potassium 04/24/2013 4.2  3.7 - 5.3 mEq/L Final  . Chloride 04/24/2013 98  96 - 112 mEq/L Final  . CO2 04/24/2013 24  19 - 32 mEq/L Final    . Glucose, Bld 04/24/2013 171* 70 - 99 mg/dL Final  . BUN 04/24/2013 27* 6 - 23 mg/dL Final  . Creatinine, Ser 04/24/2013 1.95* 0.50 - 1.35 mg/dL Final  . Calcium 04/24/2013 8.4  8.4 - 10.5 mg/dL Final  . GFR calc non Af Amer 04/24/2013 32* >90 mL/min Final  . GFR calc Af Amer 04/24/2013 37* >90 mL/min Final   Comment: (NOTE)                          The eGFR has been calculated using the CKD EPI equation.                          This calculation has not been validated in all clinical situations.                          eGFR's persistently <90 mL/min signify possible Chronic Kidney                          Disease.  . Glucose-Capillary 04/23/2013 140* 70 - 99 mg/dL Final  . Glucose-Capillary 04/23/2013 191* 70 - 99 mg/dL Final  . Glucose-Capillary 04/24/2013 208* 70 - 99 mg/dL Final  . Color, Urine 04/24/2013 AMBER* YELLOW Final   BIOCHEMICALS MAY BE AFFECTED BY COLOR  . APPearance 04/24/2013 CLOUDY* CLEAR Final  . Specific Gravity, Urine 04/24/2013 1.025  1.005 - 1.030 Final  . pH 04/24/2013 5.0  5.0 - 8.0 Final  . Glucose, UA 04/24/2013 NEGATIVE  NEGATIVE mg/dL Final  . Hgb urine dipstick 04/24/2013 NEGATIVE  NEGATIVE Final  . Bilirubin Urine 04/24/2013 NEGATIVE  NEGATIVE Final  . Ketones, ur 04/24/2013 NEGATIVE  NEGATIVE mg/dL Final  . Protein, ur 04/24/2013 NEGATIVE  NEGATIVE mg/dL Final  . Urobilinogen, UA 04/24/2013 0.2  0.0 - 1.0 mg/dL Final  . Nitrite 04/24/2013 NEGATIVE  NEGATIVE Final  .  Leukocytes, UA 04/24/2013 NEGATIVE  NEGATIVE Final   MICROSCOPIC NOT DONE ON URINES WITH NEGATIVE PROTEIN, BLOOD, LEUKOCYTES, NITRITE, OR GLUCOSE <1000 mg/dL.  . Creatinine, Urine 04/24/2013 286.2   Final   Performed at [facility]  . Sodium, Ur 04/24/2013 16   Final   Performed at [facility]  . Glucose-Capillary 04/24/2013 161* 70 - 99 mg/dL Final  . WBC 04/25/2013 8.3  4.0 - 10.5 K/uL Final  . RBC 04/25/2013 3.56* 4.22 - 5.81 MIL/uL Final  . Hemoglobin 04/25/2013  10.9* 13.0 - 17.0 g/dL Final  . HCT 04/25/2013 31.7* 39.0 - 52.0 % Final  . MCV 04/25/2013 89.0  78.0 - 100.0 fL Final  . MCH 04/25/2013 30.6  26.0 - 34.0 pg Final  . MCHC 04/25/2013 34.4  30.0 - 36.0 g/dL Final  . RDW 04/25/2013 13.2  11.5 - 15.5 % Final  . Platelets 04/25/2013 165  150 - 400 K/uL Final  . Sodium 04/25/2013 134* 137 - 147 mEq/L Final  . Potassium 04/25/2013 4.0  3.7 - 5.3 mEq/L Final  . Chloride 04/25/2013 98  96 - 112 mEq/L Final  . CO2 04/25/2013 24  19 - 32 mEq/L Final  . Glucose, Bld 04/25/2013 192* 70 - 99 mg/dL Final  . BUN 04/25/2013 35* 6 - 23 mg/dL Final  . Creatinine, Ser 04/25/2013 2.12* 0.50 - 1.35 mg/dL Final  . Calcium 04/25/2013 8.3* 8.4 - 10.5 mg/dL Final  . GFR calc non Af Amer 04/25/2013 29* >90 mL/min Final  . GFR calc Af Amer 04/25/2013 34* >90 mL/min Final   Comment: (NOTE)                          The eGFR has been calculated using the CKD EPI equation.                          This calculation has not been validated in all clinical situations.                          eGFR's persistently <90 mL/min signify possible Chronic Kidney                          Disease.  . Glucose-Capillary 04/24/2013 189* 70 - 99 mg/dL Final  . Glucose-Capillary 04/24/2013 173* 70 - 99 mg/dL Final  . Glucose-Capillary 04/25/2013 154* 70 - 99 mg/dL Final  . Glucose-Capillary 04/25/2013 165* 70 - 99 mg/dL Final  . Sodium 04/26/2013 137  137 - 147 mEq/L Final  . Potassium 04/26/2013 4.5  3.7 - 5.3 mEq/L Final  . Chloride 04/26/2013 103  96 - 112 mEq/L Final  . CO2 04/26/2013 23  19 - 32 mEq/L Final  . Glucose, Bld 04/26/2013 173* 70 - 99 mg/dL Final  . BUN 04/26/2013 34* 6 - 23 mg/dL Final  . Creatinine, Ser 04/26/2013 1.81* 0.50 - 1.35 mg/dL Final  . Calcium 04/26/2013 8.4  8.4 - 10.5 mg/dL Final  . GFR calc non Af Amer 04/26/2013 35* >90 mL/min Final  . GFR calc Af Amer 04/26/2013 41* >90 mL/min Final   Comment: (NOTE)                          The eGFR has been  calculated using the CKD EPI equation.  This calculation has not been validated in all clinical situations.                          eGFR's persistently <90 mL/min signify possible Chronic Kidney                          Disease.  . Glucose-Capillary 04/25/2013 184* 70 - 99 mg/dL Final  . Glucose-Capillary 04/26/2013 149* 70 - 99 mg/dL Final  . Comment 1 04/26/2013 Notify RN   Final  . Comment 2 04/26/2013 Documented in Chart   Final  Hospital Outpatient Visit on 04/15/2013  Component Date Value Ref Range Status  . aPTT 04/15/2013 29  24 - 37 seconds Final  . MRSA, PCR 04/15/2013 POSITIVE* NEGATIVE Final   Comment: RESULT CALLED TO, READ BACK BY AND VERIFIED WITH:                          MICHELLE IN PADM AT 1400 ON 2.12.15 BY SHEAW.  Marland Kitchen Staphylococcus aureus 04/15/2013 POSITIVE* NEGATIVE Final   Comment:                                 The Xpert SA Assay (FDA                          approved for NASAL specimens                          in patients over 47 years of age),                          is one component of                          a comprehensive surveillance                          program.  Test performance has                          been validated by [employer] for patients greater                          than or equal to 25 year old.                          It is not intended                          to diagnose infection nor to                          guide or monitor treatment.                          RESULT CALLED TO, READ BACK BY AND VERIFIED WITH:  MICHELL IN PADM AT 1400 ON 2.12.15 BY SHEAW  . Sodium 04/15/2013 140  137 - 147 mEq/L Final  . Potassium 04/15/2013 4.6  3.7 - 5.3 mEq/L Final  . Chloride 04/15/2013 102  96 - 112 mEq/L Final  . CO2 04/15/2013 23  19 - 32 mEq/L Final  . Glucose, Bld 04/15/2013 141* 70 - 99 mg/dL Final  . BUN 04/15/2013 26* 6 - 23 mg/dL Final  . Creatinine,  Ser 04/15/2013 1.49* 0.50 - 1.35 mg/dL Final  . Calcium 04/15/2013 9.7  8.4 - 10.5 mg/dL Final  . GFR calc non Af Amer 04/15/2013 44* >90 mL/min Final  . GFR calc Af Amer 04/15/2013 52* >90 mL/min Final   Comment: (NOTE)                          The eGFR has been calculated using the CKD EPI equation.                          This calculation has not been validated in all clinical situations.                          eGFR's persistently <90 mL/min signify possible Chronic Kidney                          Disease.  . WBC 04/15/2013 4.6  4.0 - 10.5 K/uL Final  . RBC 04/15/2013 4.81  4.22 - 5.81 MIL/uL Final  . Hemoglobin 04/15/2013 14.5  13.0 - 17.0 g/dL Final  . HCT 04/15/2013 42.8  39.0 - 52.0 % Final  . MCV 04/15/2013 89.0  78.0 - 100.0 fL Final  . MCH 04/15/2013 30.1  26.0 - 34.0 pg Final  . MCHC 04/15/2013 33.9  30.0 - 36.0 g/dL Final  . RDW 04/15/2013 13.3  11.5 - 15.5 % Final  . Platelets 04/15/2013 177  150 - 400 K/uL Final  . Prothrombin Time 04/15/2013 13.0  11.6 - 15.2 seconds Final  . INR 04/15/2013 1.00  0.00 - 1.49 Final  . ABO/RH(D) 04/15/2013 O NEG   Final  . Antibody Screen 04/15/2013 NEG   Final  . Sample Expiration 04/15/2013 04/25/2013   Final  . Color, Urine 04/15/2013 YELLOW  YELLOW Final  . APPearance 04/15/2013 CLEAR  CLEAR Final  . Specific Gravity, Urine 04/15/2013 1.020  1.005 - 1.030 Final  . pH 04/15/2013 5.5  5.0 - 8.0 Final  . Glucose, UA 04/15/2013 NEGATIVE  NEGATIVE mg/dL Final  . Hgb urine dipstick 04/15/2013 NEGATIVE  NEGATIVE Final  . Bilirubin Urine 04/15/2013 NEGATIVE  NEGATIVE Final  . Ketones, ur 04/15/2013 NEGATIVE  NEGATIVE mg/dL Final  . Protein, ur 04/15/2013 30* NEGATIVE mg/dL Final  . Urobilinogen, UA 04/15/2013 0.2  0.0 - 1.0 mg/dL Final  . Nitrite 04/15/2013 NEGATIVE  NEGATIVE Final  . Leukocytes, UA 04/15/2013 NEGATIVE  NEGATIVE Final  . WBC, UA 04/15/2013 0-2  <3 WBC/hpf Final  Hospital Outpatient Visit on 03/05/2013  Component Date  Value Ref Range Status  . MRSA, PCR 03/05/2013 POSITIVE* NEGATIVE Final   Comment: RESULT CALLED TO, READ BACK BY AND VERIFIED WITH:                          K.STANLEY AT [id number] ON [id number] BY C.BONGEL  . Staphylococcus aureus 03/05/2013 POSITIVE* NEGATIVE  Final   Comment:                                 The Xpert SA Assay (FDA                          approved for NASAL specimens                          in patients over 43 years of age),                          is one component of                          a comprehensive surveillance                          program.  Test performance has                          been validated by [employer] for patients greater                          than or equal to 82 year old.                          It is not intended                          to diagnose infection nor to                          guide or monitor treatment.  . Sodium 03/05/2013 137  137 - 147 mEq/L Final   Please note change in reference range.  . Potassium 03/05/2013 5.0  3.7 - 5.3 mEq/L Final   Please note change in reference range.  . Chloride 03/05/2013 102  96 - 112 mEq/L Final  . CO2 03/05/2013 23  19 - 32 mEq/L Final  . Glucose, Bld 03/05/2013 124* 70 - 99 mg/dL Final  . BUN 03/05/2013 30* 6 - 23 mg/dL Final  . Creatinine, Ser 03/05/2013 1.83* 0.50 - 1.35 mg/dL Final  . Calcium 03/05/2013 10.1  8.4 - 10.5 mg/dL Final  . GFR calc non Af Amer 03/05/2013 35* >90 mL/min Final  . GFR calc Af Amer 03/05/2013 40* >90 mL/min Final   Comment: (NOTE)                          The eGFR has been calculated using the CKD EPI equation.                          This calculation has not been validated in all clinical situations.                          eGFR's persistently <90 mL/min signify possible Chronic Kidney  Disease.  . WBC 03/05/2013 6.6  4.0 - 10.5 K/uL Final  . RBC 03/05/2013 4.93  4.22 - 5.81 MIL/uL Final  . Hemoglobin  03/05/2013 14.7  13.0 - 17.0 g/dL Final  . HCT 03/05/2013 43.8  39.0 - 52.0 % Final  . MCV 03/05/2013 88.8  78.0 - 100.0 fL Final  . MCH 03/05/2013 29.8  26.0 - 34.0 pg Final  . MCHC 03/05/2013 33.6  30.0 - 36.0 g/dL Final  . RDW 03/05/2013 13.5  11.5 - 15.5 % Final  . Platelets 03/05/2013 213  150 - 400 K/uL Final  . Prothrombin Time 03/05/2013 13.1  11.6 - 15.2 seconds Final  . INR 03/05/2013 1.01  0.00 - 1.49 Final  . ABO/RH(D) 03/05/2013 O NEG   Final  . Antibody Screen 03/05/2013 NEG   Final  . Sample Expiration 03/05/2013 03/14/2013   Final  . Color, Urine 03/05/2013 YELLOW  YELLOW Final  . APPearance 03/05/2013 CLEAR  CLEAR Final  . Specific Gravity, Urine 03/05/2013 1.018  1.005 - 1.030 Final  . pH 03/05/2013 5.5  5.0 - 8.0 Final  . Glucose, UA 03/05/2013 NEGATIVE  NEGATIVE mg/dL Final  . Hgb urine dipstick 03/05/2013 NEGATIVE  NEGATIVE Final  . Bilirubin Urine 03/05/2013 NEGATIVE  NEGATIVE Final  . Ketones, ur 03/05/2013 NEGATIVE  NEGATIVE mg/dL Final  . Protein, ur 03/05/2013 NEGATIVE  NEGATIVE mg/dL Final  . Urobilinogen, UA 03/05/2013 0.2  0.0 - 1.0 mg/dL Final  . Nitrite 03/05/2013 NEGATIVE  NEGATIVE Final  . Leukocytes, UA 03/05/2013 NEGATIVE  NEGATIVE Final   MICROSCOPIC NOT DONE ON URINES WITH NEGATIVE PROTEIN, BLOOD, LEUKOCYTES, NITRITE, OR GLUCOSE <1000 mg/dL.  . ABO/RH(D) 03/05/2013 O NEG   Final     X-Rays:Dg Chest 2 View  04/15/2013   CLINICAL DATA:  Preop for left knee replacement  EXAM: CHEST  2 VIEW  COMPARISON:  03/19/2013  FINDINGS: Cardiomediastinal silhouette is stable. No acute infiltrate or pleural effusion. No pulmonary edema. Mild degenerative changes thoracic spine.  IMPRESSION: No active cardiopulmonary disease.   Electronically Signed   By: Lahoma Crocker M.D.   On: 04/15/2013 13:49   X-ray Knee Left Ap And Lateral  04/22/2013   CLINICAL DATA:  Post left knee arthroplasty  EXAM: LEFT KNEE - 1-2 VIEW  COMPARISON:  Portable exam 6045 hr compared to  04/15/2013  FINDINGS: Components of left knee prosthesis newly identified, in expected positions.  No acute fracture, dislocation or bone destruction.  Surgical drain present.  Expected anterior soft tissue changes and skin clips.  No periprosthetic lucency.  Large calcified loose body posterior to the distal femur.  Osseous demineralization.  IMPRESSION: Left knee prosthesis without acute complication.   Electronically Signed   By: Lavonia Dana M.D.   On: 04/22/2013 12:58   Dg Knee 1-2 Views Left  04/15/2013   CLINICAL DATA:  Preop for left knee replacement  EXAM: LEFT KNEE - 1-2 VIEW  COMPARISON:  03/05/2013  FINDINGS: Three views of the left knee submitted. Again noted diffuse narrowing of joint space. Mild spurring of medial tibial plateau. Significant narrowing of patellofemoral joint space. Spurring of patella. Small joint effusion. No acute fracture or subluxation. Again noted posterior flabella. .  IMPRESSION: No acute fracture or subluxation. Again noted osteoarthritic changes as described above.   Electronically Signed   By: Lahoma Crocker M.D.   On: 04/15/2013 13:48   US Renal  04/25/2013   CLINICAL DATA:  Acute on chronic kidney disease  EXAM: RENAL/URINARY TRACT  ULTRASOUND COMPLETE  COMPARISON:  None; correlation CT abdomen and pelvis 02/04/2005  FINDINGS: Right Kidney:  Length: 9.9 cm. Normal cortical thickness. Slightly increased cortical echogenicity. No mass, hydronephrosis or shadowing calcification.  Left Kidney:  Length: 12.1 cm. Normal cortical thickness. Minimally increased cortical echogenicity. No mass, hydronephrosis or shadowing calcification.  Bladder:  Normal appearance  IMPRESSION: Minimally increased cortical echogenicity bilaterally suggesting medical renal disease.  No gross evidence of renal mass or hydronephrosis.  Right kidney smaller than left, chronic finding since 2006.   Electronically Signed   By: Lavonia Dana M.D.   On: 04/25/2013 15:44    EKG: Orders placed in visit on  12/01/12  . EKG 12-LEAD     Hospital Course: [name] is a 73 y.o. who was admitted to [facility]. They were brought to the operating room on 04/22/2013 and underwent Procedure(s): LEFT TOTAL KNEE ARTHROPLASTY.  Patient tolerated the procedure well and was later transferred to the recovery room and then to the orthopaedic floor for postoperative care.  They were given PO and IV analgesics for pain control following their surgery.  They were given 24 hours of postoperative antibiotics of  Anti-infectives   Start     Dose/Rate Route Frequency Ordered Stop   04/22/13 2000  vancomycin (VANCOCIN) 500 mg in sodium chloride 0.9 % 100 mL IVPB     500 mg 100 mL/hr over 60 Minutes Intravenous  Once 04/22/13 1334 04/22/13 2037   04/22/13 1500  ceFAZolin (ANCEF) IVPB 2 g/50 mL premix     2 g 100 mL/hr over 30 Minutes Intravenous Every 6 hours 04/22/13 1400 04/22/13 2108   04/22/13 1111  polymyxin B 500,000 Units, bacitracin 50,000 Units in sodium chloride irrigation 0.9 % 500 mL irrigation  Status:  Discontinued       As needed 04/22/13 1111 04/22/13 1142   04/22/13 0745  vancomycin (VANCOCIN) 1,250 mg in sodium chloride 0.9 % 250 mL IVPB     1,250 mg 166.7 mL/hr over 90 Minutes Intravenous  Once 04/22/13 0731 04/22/13 0857   04/22/13 0715  vancomycin (VANCOCIN) IVPB 1000 mg/200 mL premix  Status:  Discontinued     1,000 mg 200 mL/hr over 60 Minutes Intravenous On call to O.R. 04/22/13 0705 04/22/13 0730     and started on DVT prophylaxis in the form of Xarelto, TED hose and SCDs.   PT and OT were ordered for total joint protocol.  Discharge planning consulted to help with postop disposition and equipment needs.  Patient had a good night on the evening of surgery.  They started to get up OOB with therapy on day one. Hospitalist service was consulted for acute on chronic renal insufficiency with rise in BUN and Creatinine, improving at the time of D/C. Hemovac drain was pulled without  difficulty.  Continued to work with therapy into day two.  By day four, the patient had progressed with therapy and meeting their goals.  Incision was healing well.  Patient was seen in rounds and was ready to go home.   Discharge Medications: Prior to Admission medications   Medication Sig Start Date End Date Taking? Authorizing Provider  desmopressin (DDAVP) 0.2 MG tablet Take 0.2 mg by mouth at bedtime.    Yes Historical Provider, MD  doxazosin (CARDURA) 4 MG tablet Take 4 mg by mouth at bedtime.   Yes Historical Provider, MD  fenofibrate micronized (LOFIBRA) 134 MG capsule Take 134 mg by mouth at bedtime.    Yes Historical  Provider, MD  levothyroxine (SYNTHROID, LEVOTHROID) 100 MCG tablet Take 100 mcg by mouth daily before breakfast.   Yes Historical Provider, MD  losartan (COZAAR) 25 MG tablet Take 25 mg by mouth at bedtime. 12/01/12  Yes Leonie Man, MD  oxybutynin (DITROPAN-XL) 5 MG 24 hr tablet Take 10 mg by mouth at bedtime.    Yes Historical Provider, MD  temazepam (RESTORIL) 22.5 MG capsule Take 22.5 mg by mouth at bedtime as needed for sleep.   Yes Historical Provider, MD  BIOTIN PO Take 1 tablet by mouth at bedtime.     Historical Provider, MD  docusate sodium (COLACE) 100 MG capsule Take 1 capsule (100 mg total) by mouth 2 (two) times daily as needed for mild constipation. 04/22/13   Johnn Hai, MD  oxyCODONE-acetaminophen (PERCOCET) 7.5-325 MG per tablet Take 1-2 tablets by mouth every 4 (four) hours as needed for pain. 04/22/13   Johnn Hai, MD  rivaroxaban (XARELTO) 10 MG TABS tablet Take 1 tablet (10 mg total) by mouth daily. 04/22/13   Johnn Hai, MD    Diet: low sodium heart healthy Activity:WBAT Follow-up:in 10-14 days Disposition - Skilled nursing facility- [facility] Discharged Condition: good   Discharge Orders   Future Orders Complete By Expires   Call MD / Call 911  As directed    Comments:     If you experience chest pain or shortness of  breath, CALL 911 and be transported to the hospital emergency room.  If you develope a fever above 101 F, pus (white drainage) or increased drainage or redness at the wound, or calf pain, call your surgeon's office.   Constipation Prevention  As directed    Comments:     Drink plenty of fluids.  Prune juice may be helpful.  You may use a stool softener, such as Colace (over the counter) 100 mg twice a day.  Use MiraLax (over the counter) for constipation as needed.   Diet - low sodium heart healthy  As directed    Increase activity slowly as tolerated  As directed        Medication List    STOP taking these medications       aspirin EC 81 MG tablet      TAKE these medications       BIOTIN PO  Take 1 tablet by mouth at bedtime.     desmopressin 0.2 MG tablet  Commonly known as:  DDAVP  Take 0.2 mg by mouth at bedtime.     docusate sodium 100 MG capsule  Commonly known as:  COLACE  Take 1 capsule (100 mg total) by mouth 2 (two) times daily as needed for mild constipation.     doxazosin 4 MG tablet  Commonly known as:  CARDURA  Take 4 mg by mouth at bedtime.     fenofibrate micronized 134 MG capsule  Commonly known as:  LOFIBRA  Take 134 mg by mouth at bedtime.     levothyroxine 100 MCG tablet  Commonly known as:  SYNTHROID, LEVOTHROID  Take 100 mcg by mouth daily before breakfast.     losartan 25 MG tablet  Commonly known as:  COZAAR  Take 25 mg by mouth at bedtime.     oxybutynin 5 MG 24 hr tablet  Commonly known as:  DITROPAN-XL  Take 10 mg by mouth at bedtime.     oxyCODONE-acetaminophen 7.5-325 MG per tablet  Commonly known as:  PERCOCET  Take 1-2 tablets by  mouth every 4 (four) hours as needed for pain.     rivaroxaban 10 MG Tabs tablet  Commonly known as:  XARELTO  Take 1 tablet (10 mg total) by mouth daily.     temazepam 22.5 MG capsule  Commonly known as:  RESTORIL  Take 22.5 mg by mouth at bedtime as needed for sleep.           Follow-up  Information   Follow up with BEANE,JEFFREY C, MD In 2 weeks.   Specialty:  Orthopedic Surgery   Contact information:   [address] [place] [address] [phone]       Follow up with [facility]. [facility])    Contact information:   [address] [place] [address] [phone]       Signed: Cecilie Kicks. 04/26/2013, 7:49 AM

## 2013-04-26 NOTE — Progress Notes (Signed)
Physical Therapy Treatment Patient Details Name: [name] MRN: [id number] DOB: 1940-08-15 Today's Date: 04/26/2013 Time: 7948-0165 PT Time Calculation (min): 34 min  PT Assessment / Plan / Recommendation  History of Present Illness Pt is s/p L TKA   PT Comments   POD # 4 assisted pt OOB to amb to BR then in hallway.  Required increased time and KI.  Performed TKR TE's followed by ICE.  Pt plans to D/C to Rehab SNF today.   Follow Up Recommendations  SNF [facility])     Does the patient have the potential to tolerate intense rehabilitation     Barriers to Discharge        Equipment Recommendations       Recommendations for Other Services    Frequency 7X/week   Progress towards PT Goals Progress towards PT goals: Progressing toward goals  Plan      Precautions / Restrictions Precautions Precautions: Knee;Fall Precaution Comments: Instructed pt ON KI use for amb Required Braces or Orthoses: Knee Immobilizer - Left Knee Immobilizer - Left: Discontinue once straight leg raise with < 10 degree lag Restrictions Weight Bearing Restrictions: Yes LLE Weight Bearing: Weight bearing as tolerated Other Position/Activity Restrictions: WBAT   Pertinent Vitals/Pain C/o increased "SORENESS" PRE MEDICATED Ice APPLIED    Mobility  Bed Mobility Overal bed mobility: Needs Assistance Bed Mobility: Supine to Sit Supine to sit: Min assist General bed mobility comments: min assist for L LE and increased time Transfers Overall transfer level: Needs assistance Equipment used: Rolling walker (2 wheeled) Transfers: Sit to/from Stand Sit to Stand: Min assist General transfer comment: verbal cues for hand placement and LE management.  Ambulation/Gait Ambulation/Gait assistance: Min assist Ambulation Distance (Feet): 40 Feet Assistive device: Rolling walker (2 wheeled) Gait Pattern/deviations: Step-to pattern Gait velocity: decreased General Gait Details: Cues for sequence, posture  and position from RW plus increased time  Increased c/o "soreness' today vs yesterday.    Exercises   Total Knee Replacement TE's 10 reps B LE ankle pumps 10 reps towel squeezes 10 reps knee presses 10 reps heel slides  10 reps SAQ's 10 reps SLR's 10 reps ABD Followed by ICE    PT Goals (current goals can now be found in the care plan section)    Visit Information  Last PT Received On: 04/26/13 Assistance Needed: +1 History of Present Illness: Pt is s/p L TKA    Subjective Data      Cognition       Balance     End of Session PT - End of Session Equipment Utilized During Treatment: Gait belt;Left knee immobilizer Activity Tolerance: Patient tolerated treatment well Patient left: in chair;with call [place]/phone within reach;with family/visitor present CPM Left Knee CPM Left Knee: Off   [name]  PTA [facility]  Acute  Rehab Pager      [phone]

## 2013-04-26 NOTE — Progress Notes (Signed)
Subjective: 4 Days Post-Op Procedure(s) (LRB): LEFT TOTAL KNEE ARTHROPLASTY (Left) Patient reports pain as mild.   Pt seen in AM rounds for Dr. Tonita Cong. Reports his pain is much improved, still sore when getting OOB with PT. His daughter is here with him, she is concerned about lethargy through the weekend, likely due to addition of benadryl for pruritis. He is voiding without difficulty, passing flatus, no BM. Feels ready for D/C. Has no other c/o this AM.  Objective: Vital signs in last 24 hours: Temp:  [98.8 F (37.1 C)-99.8 F (37.7 C)] 99.5 F (37.5 C) (02/23 0545) Pulse Rate:  [73-78] 73 (02/23 0545) Resp:  [18-20] 18 (02/23 0545) BP: (114-121)/(53-62) 121/57 mmHg (02/23 0545) SpO2:  [91 %-96 %] 94 % (02/23 0545)  Intake/Output from previous day: 02/22 0701 - 02/23 0700 In: 2433.8 [P.O.:720; I.V.:1713.8] Out: 1300 [Urine:1300] Intake/Output this shift:     Recent Labs  04/24/13 0506 04/25/13 0526  HGB 11.2* 10.9*    Recent Labs  04/24/13 0506 04/25/13 0526  WBC 9.1 8.3  RBC 3.79* 3.56*  HCT 34.1* 31.7*  PLT 149* 165    Recent Labs  04/25/13 0526 04/26/13 0436  NA 134* 137  K 4.0 4.5  CL 98 103  CO2 24 23  BUN 35* 34*  CREATININE 2.12* 1.81*  GLUCOSE 192* 173*  CALCIUM 8.3* 8.4   No results found for this basename: LABPT, INR,  in the last 72 hours  Neurologically intact ABD soft Neurovascular intact Sensation intact distally Intact pulses distally Dorsiflexion/Plantar flexion intact Incision: dressing C/D/I and no drainage No cellulitis present Compartment soft no calf pain or sign of DVT  Assessment/Plan: 4 Days Post-Op Procedure(s) (LRB): LEFT TOTAL KNEE ARTHROPLASTY (Left) Advance diet Up with therapy D/C IV fluids Discharge to SNF today- [facility] Discussed lethargy with daughter, likely due to pain medication plus benadryl Will recheck CBC this AM to be sure Hgb is stable prior to D/C Hospitalist service to see again this AM for  acute on chronic renal insufficiency, Creatinine improved this AM Discussed D/C instructions Follow up 10-14 days post-op with Dr. Tonita Cong for staple removal and xrays  Will discuss with Dr. Mliss Fritz, [place]. 04/26/2013, 7:43 AM

## 2013-04-26 NOTE — Progress Notes (Signed)
Noted chroninc cough subsequent to PNA in Jan Thinks could be ? Med realted Tol diet well Not confused or lethargic at all. Pain moderate.   Eating and drinking. Passing urine regularly Note he has a nephrologist, Dr. Louanna Raw who follows him--ARB was d/c by Dr. Karleen Hampshire  No SOb, CP  EOMI, NCAt, s1 s2 no m/r/g Wound dressing reviewed, wound not examined Neuro grossly intact   Stable for d/c from my stand-point Recommend follow up on subacute cough [potentitially post viral-NO h/o pneumoconiosis, non -smoker] and defer to PCP   Verneita Griffes, MD [facility] [phone]

## 2013-05-06 ENCOUNTER — Encounter (HOSPITAL_COMMUNITY): Admission: RE | Payer: Self-pay | Source: Ambulatory Visit

## 2013-05-06 ENCOUNTER — Inpatient Hospital Stay (HOSPITAL_COMMUNITY): Admission: RE | Admit: 2013-05-06 | Payer: Medicare Other | Source: Ambulatory Visit | Admitting: Specialist

## 2013-05-06 SURGERY — ARTHROPLASTY, KNEE, TOTAL
Anesthesia: General | Site: Knee | Laterality: Left

## 2013-06-10 ENCOUNTER — Ambulatory Visit (HOSPITAL_COMMUNITY)
Admission: RE | Admit: 2013-06-10 | Discharge: 2013-06-10 | Disposition: A | Discharge status: Home / Self Care | Payer: Medicare Other | Source: Ambulatory Visit | Attending: Specialist | Admitting: Specialist

## 2013-06-10 NOTE — Evaluation (Signed)
Physical Therapy consult/ patient elected to postpone evaluation until he contact insurance company   Patient Details  Name: [name] MRN: [id number] Date of Birth: 03-28-1940  Today's Date: 06/10/2013 Time: 1015-1020 PT Time Calculation (min): 5 min  NO CHARGES             Visit#:   of    Re-eval:   Assessment Diagnosis: L TKR 04/22/13  Surgical Date: 04/22/13 Next MD Visit: Dr Tonita Cong  Prior Therapy: yes   Authorization: medicare complete / united     Authorization Time Period:    Authorization Visit#:   of     Past Medical History:  Past Medical History  Diagnosis Date  . Type II diabetes mellitus, well controlled     with diet and exercise  . Hypertension, essential, benign   . Dyslipidemia     On fenofibrate and fish oil  . Hypothyroidism   . BPH (benign prostatic hyperplasia)      followed by Dr. Risa Grill  . CKD (chronic kidney disease) stage 3, GFR 30-59 ml/min      Dr. Arty Baumgartner  . Arthritis   . Tinnitus   . History of kidney stones 15 years ago  . Tendonitis of elbow, right   . Hearing loss   . 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 Hx of colonic polyps   . Hernia     from 2006 surgery   . Staph infection 4 years ago  . Pneumonia 12/14-1/15    04/15/13-continued cough, non productive, no fever   Past Surgical History:  Past Surgical History  Procedure Laterality Date  . Eye surgery Left 1974    eye removed for eye cancer  . Colonoscopy w/ polypectomy  2012  . Biceps tendon repair Right 2004  . Shoulder arthroscopy Left   . Small intestine surgery  2006    "took everything out and fixed it"  . Tonsillectomy  1970's  . Vasectomy  1975  . Back surgery  1985    lower back, "2 disc removed"  . Total knee arthroplasty Left 04/22/2013    Procedure: LEFT TOTAL KNEE ARTHROPLASTY;  Surgeon: Johnn Hai, MD;  Location: [facility];  Service: Orthopedics;  Laterality: Left;    Subjective Symptoms/Limitations Symptoms: c/o left knee   stifness, weakness  post TKR left 04/22/13 and knee ligmanet stain 5 weeks after surgery from stair use, stated he would like to hold on rehab and evaluation because he needs to call his insurance company about co pay. 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, recent R knee injection for pain control   Precautions/Restrictions     Balance Screening Balance Screen Has the patient fallen in the past 6 months: No  Prior Functioning  Prior Function Comments: long standing B knee pain limiting activity level       Sensation/Coordination/Flexibility/Functional Tests Functional Tests Functional Tests: foto 35       Physical Therapy Assessment and Plan PT Assessment and Plan PT Plan: hold on evaluation, no charges today, patient wants to return home and call insurance company regarding co pay        Problem List Patient Active Problem List   Diagnosis Date Noted  . 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 Obesity (BMI 30-39.9) 12/01/2012  . Essential hypertension 12/01/2012  . HTN (hypertension) 12/01/2012  . Dyslipidemia 12/01/2012  . Type II diabetes mellitus, well controlled   . CKD (  chronic kidney disease) stage 3, GFR 30-59 ml/min     PT Plan of Care PT Patient Instructions: call l clinic to re schedule initial evaluation  Consulted and Agree with Plan of Care: Patient  GP    Sarajane Marek 06/10/2013, 9:52 AM  Physician Documentation Your signature is required to indicate approval of the treatment plan as stated above.  Please sign and either send electronically or make a copy of this report for your files and return this physician signed original.   Please mark one 1.__approve of plan  2. ___approve of plan with the following conditions.   ______________________________                                                          _____________________ Physician Signature                                                                                                              Date

## 2013-07-22 NOTE — Addendum Note (Signed)
Encounter addended by: Guerry Bruin, OT on: 07/22/2013  9:42 AM<BR>     Documentation filed: Episodes

## 2013-09-22 ENCOUNTER — Other Ambulatory Visit: Payer: Self-pay | Admitting: Orthopedic Surgery

## 2013-10-12 ENCOUNTER — Encounter (HOSPITAL_COMMUNITY): Payer: Self-pay | Admitting: Pharmacy Technician

## 2013-10-13 NOTE — Patient Instructions (Signed)
[name]  10/13/2013   Your procedure is scheduled on:10/21/2013      Report to [facility].  Follow the Signs to [place] at  0800      am  Call this number if you have problems the morning of surgery: [phone]   Remember:   Do not eat food or drink liquids after midnight.   Take these medicines the morning of surgery with A SIP OF WATER:    Do not wear jewelry,   Do not wear lotions, powders, or perfumes, deodorant.    . Men may shave face and neck.  Do not bring valuables to the hospital.  Contacts, dentures or bridgework may not be worn into surgery.  Leave suitcase in the car. After surgery it may be brought to your room.  For patients admitted to the hospital, checkout time is 11:00 AM the day of  discharge.       [facility] - Preparing for Surgery Before surgery, you can play an important role.  Because skin is not sterile, your skin needs to be as free of germs as possible.  You can reduce the number of germs on your skin by washing with CHG (chlorahexidine gluconate) soap before surgery.  CHG is an antiseptic cleaner which kills germs and bonds with the skin to continue killing germs even after washing. Please DO NOT use if you have an allergy to CHG or antibacterial soaps.  If your skin becomes reddened/irritated stop using the CHG and inform your nurse when you arrive at Short Stay. Do not shave (including legs and underarms) for at least 48 hours prior to the first CHG shower.  You may shave your face/neck. Please follow these instructions carefully:  1.  Shower with CHG Soap the night before surgery and the  morning of Surgery.  2.  If you choose to wash your hair, wash your hair first as usual with your  normal  shampoo.  3.  After you shampoo, rinse your hair and body thoroughly to remove the  shampoo.                           4.  Use CHG as you would any other liquid soap.  You can apply chg directly  to the skin and wash    Gently with a scrungie or clean washcloth.  5.  Apply the CHG Soap to your body ONLY FROM THE NECK DOWN.   Do not use on face/ open                           Wound or open sores. Avoid contact with eyes, ears mouth and genitals (private parts).                       Wash face,  Genitals (private parts) with your normal soap.             6.  Wash thoroughly, paying special attention to the area where your surgery  will be performed.  7.  Thoroughly rinse your body with warm water from the neck down.  8.  DO NOT shower/wash with your normal soap after using and rinsing off  the CHG Soap.                9.  Pat yourself dry with a clean towel.  10.  Wear clean pajamas.            11.  Place clean sheets on your bed the night of your first shower and do not  sleep with pets. Day of Surgery : Do not apply any lotions/deodorants the morning of surgery.  Please wear clean clothes to the hospital/surgery center.  FAILURE TO FOLLOW THESE INSTRUCTIONS MAY RESULT IN THE CANCELLATION OF YOUR SURGERY PATIENT SIGNATURE_________________________________  NURSE SIGNATURE__________________________________  ________________________________________________________________________  WHAT IS A BLOOD TRANSFUSION? Blood Transfusion Information  A transfusion is the replacement of blood or some of its parts. Blood is made up of multiple cells which provide different functions.  Red blood cells carry oxygen and are used for blood loss replacement.  White blood cells fight against infection.  Platelets control bleeding.  Plasma helps clot blood.  Other blood products are available for specialized needs, such as hemophilia or other clotting disorders. BEFORE THE TRANSFUSION  Who gives blood for transfusions?   Healthy volunteers who are fully evaluated to make sure their blood is safe. This is blood bank blood. Transfusion therapy is the safest it has ever been in the practice of medicine. Before  blood is taken from a donor, a complete history is taken to make sure that person has no history of diseases nor engages in risky social behavior (examples are intravenous drug use or sexual activity with multiple partners). The donor's travel history is screened to minimize risk of transmitting infections, such as malaria. The donated blood is tested for signs of infectious diseases, such as HIV and hepatitis. The blood is then tested to be sure it is compatible with you in order to minimize the chance of a transfusion reaction. If you or a relative donates blood, this is often done in anticipation of surgery and is not appropriate for emergency situations. It takes many days to process the donated blood. RISKS AND COMPLICATIONS Although transfusion therapy is very safe and saves many lives, the main dangers of transfusion include:   Getting an infectious disease.  Developing a transfusion reaction. This is an allergic reaction to something in the blood you were given. Every precaution is taken to prevent this. The decision to have a blood transfusion has been considered carefully by your caregiver before blood is given. Blood is not given unless the benefits outweigh the risks. AFTER THE TRANSFUSION  Right after receiving a blood transfusion, you will usually feel much better and more energetic. This is especially true if your red blood cells have gotten low (anemic). The transfusion raises the level of the red blood cells which carry oxygen, and this usually causes an energy increase.  The nurse administering the transfusion will monitor you carefully for complications. HOME CARE INSTRUCTIONS  No special instructions are needed after a transfusion. You may find your energy is better. Speak with your caregiver about any limitations on activity for underlying diseases you may have. SEEK MEDICAL CARE IF:   Your condition is not improving after your transfusion.  You develop redness or irritation  at the intravenous (IV) site. SEEK IMMEDIATE MEDICAL CARE IF:  Any of the following symptoms occur over the next 12 hours:  Shaking chills.  You have a temperature by mouth above 102 F (38.9 C), not controlled by medicine.  Chest, back, or muscle pain.  People around you feel you are not acting correctly or are confused.  Shortness of breath or difficulty breathing.  Dizziness and fainting.  You get a rash or develop  hives.  You have a decrease in urine output.  Your urine turns a dark color or changes to pink, red, or brown. Any of the following symptoms occur over the next 10 days:  You have a temperature by mouth above 102 F (38.9 C), not controlled by medicine.  Shortness of breath.  Weakness after normal activity.  The white part of the eye turns yellow (jaundice).  You have a decrease in the amount of urine or are urinating less often.  Your urine turns a dark color or changes to pink, red, or brown. Document Released: 02/16/2000 Document Revised: 05/13/2011 Document Reviewed: 10/05/2007 ExitCare Patient Information 2014 [place].  _______________________________________________________________________  Incentive Spirometer  An incentive spirometer is a tool that can help keep your lungs clear and active. This tool measures how well you are filling your lungs with each breath. Taking long deep breaths may help reverse or decrease the chance of developing breathing (pulmonary) problems (especially infection) following:  A long period of time when you are unable to move or be active. BEFORE THE PROCEDURE   If the spirometer includes an indicator to show your best effort, your nurse or respiratory therapist will set it to a desired goal.  If possible, sit up straight or lean slightly forward. Try not to slouch.  Hold the incentive spirometer in an upright position. INSTRUCTIONS FOR USE  1. Sit on the edge of your bed if possible, or sit up as far as  you can in bed or on a chair. 2. Hold the incentive spirometer in an upright position. 3. Breathe out normally. 4. Place the mouthpiece in your mouth and seal your lips tightly around it. 5. Breathe in slowly and as deeply as possible, raising the piston or the ball toward the top of the column. 6. Hold your breath for 3-5 seconds or for as long as possible. Allow the piston or ball to fall to the bottom of the column. 7. Remove the mouthpiece from your mouth and breathe out normally. 8. Rest for a few seconds and repeat Steps 1 through 7 at least 10 times every 1-2 hours when you are awake. Take your time and take a few normal breaths between deep breaths. 9. The spirometer may include an indicator to show your best effort. Use the indicator as a goal to work toward during each repetition. 10. After each set of 10 deep breaths, practice coughing to be sure your lungs are clear. If you have an incision (the cut made at the time of surgery), support your incision when coughing by placing a pillow or rolled up towels firmly against it. Once you are able to get out of bed, walk around indoors and cough well. You may stop using the incentive spirometer when instructed by your caregiver.  RISKS AND COMPLICATIONS  Take your time so you do not get dizzy or light-headed.  If you are in pain, you may need to take or ask for pain medication before doing incentive spirometry. It is harder to take a deep breath if you are having pain. AFTER USE  Rest and breathe slowly and easily.  It can be helpful to keep track of a log of your progress. Your caregiver can provide you with a simple table to help with this. If you are using the spirometer at home, follow these instructions: [place] IF:   You are having difficultly using the spirometer.  You have trouble using the spirometer as often as instructed.  Your  pain medication is not giving enough relief while using the spirometer.  You develop  fever of 100.5 F (38.1 C) or higher. SEEK IMMEDIATE MEDICAL CARE IF:   You cough up bloody sputum that had not been present before.  You develop fever of 102 F (38.9 C) or greater.  You develop worsening pain at or near the incision site. MAKE SURE YOU:   Understand these instructions.  Will watch your condition.  Will get help right away if you are not doing well or get worse. Document Released: 07/01/2006 Document Revised: 05/13/2011 Document Reviewed: 09/01/2006 ExitCare Patient Information 2014 ExitCare, [place].   ________________________________________________________________________    Please read over the following fact sheets that you were given: MRSA Information, coughing and deep breathing exercises, leg exercises

## 2013-10-14 ENCOUNTER — Ambulatory Visit: Payer: Self-pay | Admitting: Orthopedic Surgery

## 2013-10-14 NOTE — H&P (Signed)
[name] DOB: Apr 19, 1940 Single / Language: Cleophus Molt / Race: White Male  H&P date 10/12/13  Chief complaint: Right knee pain  History of Present Illness  The patient is a 72 year old male who comes in today for a preoperative history and physical. The patient is scheduled for a right total knee arthroplasty to be performed by Dr. Johnn Hai, MD at [facility] on October 21, 2013. Pt with progressively worsening R knee arthritic symptoms, worse in the last 6 months following knee replacement on the left, refractory to recent steroid injection (depomedrol), bracing, activity modifications, PT/HEP, relative rest, pain medications. At this point given the collapse of his joint space, varus deformity, bone on bone changes, and ongoing pain, recommend proceeding with total knee replacement on the right. Discussed the procedure itself as well as risks, complications and alternatives, including but not limited to DVT, PE, infx, bleeding, failure of procedure, need for secondary procedure including manipulation, nerve injury, ongoing pain/symptoms, anesthesia risk, even stroke or death. Also discussed typical post-op protocols, activity restrictions, need for PT, flexion/extension exercises, time out of work. Discussed need for DVT ppx post-op with Xarelto then ASA per protocol. Discussed dental ppx. Also discussed limitations post-operatively such as kneeling and squatting. All questions were answered. Patient desires to proceed with surgery. He has been cleared by his PCP. 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  Allergies  No Known Drug Allergies09/30/2014  Family History  Cerebrovascular Accident grandfather fathers side Chronic Obstructive Lung Disease grandmother fathers side Congestive Heart Failure grandmother mothers side Heart Disease grandmother mothers side and grandfather mothers  side Hypertension mother Rheumatoid Arthritis child Cancer First Degree Relatives. mother and father First Degree Relatives  Social History  Tobacco use Never smoker. never smoker Post-Surgical Plans rehab- [facility], [place] Directives living will Children 1 Alcohol use never consumed alcohol Previously in rehab no Pain Contract no Number of flights of stairs before winded 4-5 Tobacco / smoke exposure no Most recent primary occupation Oceanographer Exercise Exercises weekly; does running / walking Drug/Alcohol Rehab (Currently) no Current work status working part time Living situation live alone Marital status widowed Illicit drug use no  Medication History Levothyroxine Sodium (100MCG Tablet, Oral) Active. Oxybutynin Chloride (5MG  Tablet, Oral) Active. Doxazosin Mesylate (4MG  Tablet, Oral) Active. Losartan Potassium (25MG  Tablet, Oral) Active. Desmopressin Acetate (0.1MG  Tablet, Oral) Active. Percocet (5-325MG  Tablet, Oral) Active. Aspirin (81MG  Tablet, 1 (one) Oral) Active. Medications Reconciled  Past Surgical History Enucleation of Eyeball-Left 1974; [place] Other Surgery Tendon Biceps Repair, Right Arm. Intestinal blockage. Vasectomy Tonsillectomy Spinal Surgery Colon Polyp Removal - Colonoscopy Arthroscopy of Shoulder left Total Knee Replacement - Left Dr. Tonita Cong 04/2013  Past Medical Hx Prostate Disease Kidney Stone Hypothyroidism Hypercholesterolemia High blood pressure Chronic Pain Cancer L eye, 1974 Impaired Vision Arthritis Diabetes Mellitus, Type II diet controlled Chronic Renal Failure Syndrome stage 2 Tinnitus Impaired Hearing Rheumatoid Arthritis Impaired Memory Pneumonia Staphlococcus Infections hx + MRSA  Review of Systems General Present- Fatigue. Not Present- Chills, Fever, Memory Loss, Night Sweats, Weight Gain and Weight Loss. Skin Not Present- Eczema, Hives,  Itching, Lesions and Rash. HEENT Present- Hearing Loss and Tinnitus. Not Present- Dentures, Double Vision, Headache and Visual Loss. Respiratory Present- Cough. Not Present- Allergies, Chronic Cough, Coughing up blood, Shortness of breath at rest and Shortness of breath with exertion. Cardiovascular Not Present- Chest Pain, Difficulty Breathing Lying Down, Murmur, Palpitations, Racing/skipping heartbeats and Swelling.  Gastrointestinal Present- Difficulty Swallowing. Not Present- Abdominal Pain, Bloody Stool, Constipation, Diarrhea, Heartburn, Jaundice, Loss of appetitie, Nausea and Vomiting. Male Genitourinary Present- Urinary frequency, Urinating at Night and Weak urinary stream. Not Present- Blood in Urine, Discharge, Flank Pain, Incontinence, Painful Urination, Urgency and Urinary Retention. Musculoskeletal Present- Joint Pain and Muscle Weakness. Not Present- Back Pain, Joint Swelling, Morning Stiffness, Muscle Pain and Spasms. Neurological Not Present- Blackout spells, Difficulty with balance, Dizziness, Paralysis, Tremor and Weakness. Psychiatric Not Present- Insomnia.  Physical Exam  General Mental Status -Alert, cooperative and good historian. General Appearance-pleasant, Not in acute distress. Orientation-Oriented X3. Build & Nutrition-Well nourished and Well developed. Gait-Stiff and Antalgic.  Head and Neck Head-normocephalic, atraumatic . Neck Global Assessment - supple, no bruit auscultated on the right, no bruit auscultated on the left.  Eye Pupil - Bilateral-Regular and Round. Motion - Bilateral-EOMI.  Chest and Lung Exam Auscultation Breath sounds - clear at anterior chest wall and clear at posterior chest wall. Adventitious sounds - No Adventitious sounds.  Cardiovascular Auscultation Rhythm - Regular rate and rhythm. Heart Sounds - S1 WNL and S2 WNL. Murmurs & Other Heart Sounds - Auscultation of the heart reveals - No  Murmurs.  Abdomen Palpation/Percussion Tenderness - Abdomen is non-tender to palpation. Rigidity (guarding) - Abdomen is soft. Auscultation Auscultation of the abdomen reveals - Bowel sounds normal.  Male Genitourinary Note: Not done, not pertinent to present illness  Musculoskeletal Note: Right Knee: Inspection and Palpation - Tenderness - medial joint line tender to palpation, no tenderness to palpation of the superior calf, no tenderness to palpation of the pes anserine bursa, no tenderness to palpation of the quadriceps tendon, no tenderness to palpation of the patellar tendon, no tenderness to palpation of the patella, no tenderness to palpation of the lateral joint line, no tenderness to palpation of the fibular head, no tenderness to palpation of the peroneal nerve. Patellar Tendon - no pain to palpation of the patellar tendon. Swelling - periarticular swelling present. Effusion - trace. Tissue tension/texture is - soft. Crepitus - mild patellofemoral crepitus. Pulses - 2+. Sensation - intact to light touch. Skin - Color - no ecchymosis, no erythema. Strength and Tone - Quadriceps - 5/5. Hamstrings - 5/5. ROM: Flexion - AROM - 100 . Extension - AROM - 0 . Stability - Valgus Laxity at 30 - None. Valgus Laxity at 0 - None. Varus Laxity at 30 - None. Varus Laxity at 0 - None. Note: painful. Lachman - Negative. Anterior Drawer Test - Negative. Posterior Drawer Test - Negative. Right Knee - Deformities/Malalignments/Discrepancies - no deformities noted. Special Tests - McMurray Test (lateral) - negative. McMurray Test (medial) - negative. Patellar Compression Pain - mild pain.  Imaging Prior xrays reviewed. Right knee with severe medial and PF degenerative changes, bone-on-bone.  Assessment & Plan  Right knee DJD  Pt with R knee DJD, end-stage, bone-on-bone, ongoing pain refractory to steroid injections, quad strengthening, bracing, activity modifications, relative rest, pain  medications, PT. He is scheduled for Right total knee replacement on 8/20 by Dr. Tonita Cong. He has been cleared by his PCP. We discussed again the procedure itself as well as risks, complications and alternatives, including but not limited to DVT, PE, infx, bleeding, failure of procedure, need for secondary procedure including manipulation, nerve injury, ongoing pain/symptoms, anesthesia risk, even stroke or death. Also discussed typical post-op protocols, activity restrictions, need for PT, flexion/extension exercises, time out of work. Discussed need for DVT ppx post-op with Xarelto then ASA per protocol. Discussed dental  ppx. Also discussed limitations post-operatively such as kneeling and squatting. All questions were answered. Patient desires to proceed with surgery. He will remain NPO after MN the night before surgery, hold medications accordingly. Plan Percocet 7.5mg  on D/C from hospital, Xarelto for DVT ppx. Plan kefzol and vanco pre-op with his prior hx of MRSA. [facility] pre-[facility] appt is later this week. Plan to D/C to rehab post-op at [facility] in [place] where there is a CPM machine he will be able to use to help with his PT. He will follow up 10-14 days post-op for staple removal and xrays and will call with any questions or concerns in the interim.  Signed electronically by Lacie Draft, PA-C for Dr. Tonita Cong

## 2013-10-15 ENCOUNTER — Encounter (HOSPITAL_COMMUNITY): Payer: Self-pay

## 2013-10-15 ENCOUNTER — Ambulatory Visit (HOSPITAL_COMMUNITY)
Admission: RE | Admit: 2013-10-15 | Discharge: 2013-10-15 | Disposition: A | Discharge status: Home / Self Care | Payer: Medicare Other | Source: Ambulatory Visit | Attending: Orthopedic Surgery | Admitting: Orthopedic Surgery

## 2013-10-15 ENCOUNTER — Encounter (HOSPITAL_COMMUNITY)
Admission: RE | Admit: 2013-10-15 | Discharge: 2013-10-15 | Disposition: A | Discharge status: Home / Self Care | Payer: Medicare Other | Source: Ambulatory Visit | Attending: Specialist | Admitting: Specialist

## 2013-10-15 DIAGNOSIS — Z01818 Encounter for other preprocedural examination: Secondary | ICD-10-CM | POA: Insufficient documentation

## 2013-10-15 DIAGNOSIS — IMO0002 Reserved for concepts with insufficient information to code with codable children: Secondary | ICD-10-CM | POA: Diagnosis not present

## 2013-10-15 DIAGNOSIS — Z01812 Encounter for preprocedural laboratory examination: Secondary | ICD-10-CM | POA: Insufficient documentation

## 2013-10-15 DIAGNOSIS — M1710 Unilateral primary osteoarthritis, unspecified knee: Secondary | ICD-10-CM | POA: Insufficient documentation

## 2013-10-15 LAB — BASIC METABOLIC PANEL
Anion gap: 15 (ref 5–15)
BUN: 19 mg/dL (ref 6–23)
CHLORIDE: 99 meq/L (ref 96–112)
CO2: 22 meq/L (ref 19–32)
Calcium: 10.2 mg/dL (ref 8.4–10.5)
Creatinine, Ser: 1.29 mg/dL (ref 0.50–1.35)
GFR calc Af Amer: 61 mL/min — ABNORMAL LOW (ref >90)
GFR, EST NON AFRICAN AMERICAN: 53 mL/min — AB (ref >90)
GLUCOSE: 136 mg/dL — AB (ref 70–99)
Potassium: 4.6 mEq/L (ref 3.7–5.3)
SODIUM: 136 meq/L — AB (ref 137–147)

## 2013-10-15 LAB — URINALYSIS, ROUTINE W REFLEX MICROSCOPIC
BILIRUBIN URINE: NEGATIVE
GLUCOSE, UA: NEGATIVE mg/dL
Hgb urine dipstick: NEGATIVE
Ketones, ur: NEGATIVE mg/dL
Leukocytes, UA: NEGATIVE
Nitrite: NEGATIVE
Protein, ur: NEGATIVE mg/dL
SPECIFIC GRAVITY, URINE: 1.012 (ref 1.005–1.030)
UROBILINOGEN UA: 0.2 mg/dL (ref 0.0–1.0)
pH: 5.5 (ref 5.0–8.0)

## 2013-10-15 LAB — CBC
HEMATOCRIT: 43.6 % (ref 39.0–52.0)
HEMOGLOBIN: 15.1 g/dL (ref 13.0–17.0)
MCH: 30.1 pg (ref 26.0–34.0)
MCHC: 34.6 g/dL (ref 30.0–36.0)
MCV: 86.9 fL (ref 78.0–100.0)
Platelets: 178 10*3/uL (ref 150–400)
RBC: 5.02 MIL/uL (ref 4.22–5.81)
RDW: 13.1 % (ref 11.5–15.5)
WBC: 5 10*3/uL (ref 4.0–10.5)

## 2013-10-15 LAB — APTT: aPTT: 33 seconds (ref 24–37)

## 2013-10-15 LAB — PROTIME-INR
INR: 0.99 (ref 0.00–1.49)
PROTHROMBIN TIME: 13.1 s (ref 11.6–15.2)

## 2013-10-15 LAB — SURGICAL PCR SCREEN
MRSA, PCR: POSITIVE — AB
STAPHYLOCOCCUS AUREUS: POSITIVE — AB

## 2013-10-15 NOTE — Progress Notes (Signed)
EKG- 12/01/12 in [facility]  CXR-04/15/13 [facility]

## 2013-10-15 NOTE — Progress Notes (Signed)
Left patient a message on phone number- [phone].

## 2013-10-15 NOTE — Progress Notes (Addendum)
DR Colodonato- LOV 10/11/2013 on chart along with labs done 10/04/2013 on chart.   Dr Ellyn Hack- 12/01/2012 in [place].

## 2013-10-21 ENCOUNTER — Inpatient Hospital Stay (HOSPITAL_COMMUNITY)
Admission: RE | Admit: 2013-10-21 | Discharge: 2013-10-25 | DRG: 470 | Disposition: A | Discharge status: Skilled Nursing Facility | Payer: Medicare Other | Source: Ambulatory Visit | Attending: Specialist | Admitting: Specialist

## 2013-10-21 ENCOUNTER — Encounter (HOSPITAL_COMMUNITY)
Admission: RE | Disposition: A | Discharge status: Skilled Nursing Facility | Payer: Self-pay | Source: Ambulatory Visit | Attending: Specialist

## 2013-10-21 ENCOUNTER — Encounter (HOSPITAL_COMMUNITY): Payer: Self-pay | Admitting: *Deleted

## 2013-10-21 ENCOUNTER — Encounter (HOSPITAL_COMMUNITY): Payer: Medicare Other | Admitting: Certified Registered Nurse Anesthetist

## 2013-10-21 ENCOUNTER — Inpatient Hospital Stay (HOSPITAL_COMMUNITY): Payer: Medicare Other

## 2013-10-21 ENCOUNTER — Inpatient Hospital Stay (HOSPITAL_COMMUNITY): Payer: Medicare Other | Admitting: Certified Registered Nurse Anesthetist

## 2013-10-21 DIAGNOSIS — Z7982 Long term (current) use of aspirin: Secondary | ICD-10-CM | POA: Diagnosis not present

## 2013-10-21 DIAGNOSIS — Z8614 Personal history of Methicillin resistant Staphylococcus aureus infection: Secondary | ICD-10-CM | POA: Diagnosis not present

## 2013-10-21 DIAGNOSIS — E785 Hyperlipidemia, unspecified: Secondary | ICD-10-CM | POA: Diagnosis present

## 2013-10-21 DIAGNOSIS — E7800 Pure hypercholesterolemia, unspecified: Secondary | ICD-10-CM | POA: Diagnosis present

## 2013-10-21 DIAGNOSIS — I129 Hypertensive chronic kidney disease with stage 1 through stage 4 chronic kidney disease, or unspecified chronic kidney disease: Secondary | ICD-10-CM | POA: Diagnosis present

## 2013-10-21 DIAGNOSIS — Z79899 Other long term (current) drug therapy: Secondary | ICD-10-CM

## 2013-10-21 DIAGNOSIS — M1710 Unilateral primary osteoarthritis, unspecified knee: Principal | ICD-10-CM | POA: Diagnosis present

## 2013-10-21 DIAGNOSIS — Z87442 Personal history of urinary calculi: Secondary | ICD-10-CM | POA: Diagnosis not present

## 2013-10-21 DIAGNOSIS — N429 Disorder of prostate, unspecified: Secondary | ICD-10-CM | POA: Diagnosis present

## 2013-10-21 DIAGNOSIS — M069 Rheumatoid arthritis, unspecified: Secondary | ICD-10-CM | POA: Diagnosis present

## 2013-10-21 DIAGNOSIS — Z823 Family history of stroke: Secondary | ICD-10-CM | POA: Diagnosis not present

## 2013-10-21 DIAGNOSIS — M25569 Pain in unspecified knee: Secondary | ICD-10-CM | POA: Diagnosis present

## 2013-10-21 DIAGNOSIS — N183 Chronic kidney disease, stage 3 unspecified: Secondary | ICD-10-CM | POA: Diagnosis present

## 2013-10-21 DIAGNOSIS — H9190 Unspecified hearing loss, unspecified ear: Secondary | ICD-10-CM | POA: Diagnosis present

## 2013-10-21 DIAGNOSIS — M898X9 Other specified disorders of bone, unspecified site: Secondary | ICD-10-CM | POA: Diagnosis present

## 2013-10-21 DIAGNOSIS — E039 Hypothyroidism, unspecified: Secondary | ICD-10-CM | POA: Diagnosis present

## 2013-10-21 DIAGNOSIS — G8929 Other chronic pain: Secondary | ICD-10-CM | POA: Diagnosis present

## 2013-10-21 DIAGNOSIS — Z8249 Family history of ischemic heart disease and other diseases of the circulatory system: Secondary | ICD-10-CM | POA: Diagnosis not present

## 2013-10-21 DIAGNOSIS — H547 Unspecified visual loss: Secondary | ICD-10-CM | POA: Diagnosis present

## 2013-10-21 DIAGNOSIS — R413 Other amnesia: Secondary | ICD-10-CM | POA: Diagnosis present

## 2013-10-21 DIAGNOSIS — M1711 Unilateral primary osteoarthritis, right knee: Secondary | ICD-10-CM | POA: Diagnosis present

## 2013-10-21 DIAGNOSIS — Z860100 Personal history of colon polyps, unspecified: Secondary | ICD-10-CM

## 2013-10-21 DIAGNOSIS — E119 Type 2 diabetes mellitus without complications: Secondary | ICD-10-CM | POA: Diagnosis present

## 2013-10-21 HISTORY — PX: TOTAL KNEE ARTHROPLASTY: SHX125

## 2013-10-21 LAB — TYPE AND SCREEN
ABO/RH(D): O NEG
ANTIBODY SCREEN: NEGATIVE

## 2013-10-21 LAB — GLUCOSE, CAPILLARY
GLUCOSE-CAPILLARY: 131 mg/dL — AB (ref 70–99)
GLUCOSE-CAPILLARY: 140 mg/dL — AB (ref 70–99)
GLUCOSE-CAPILLARY: 125 mg/dL — AB (ref 70–99)
Glucose-Capillary: 134 mg/dL — ABNORMAL HIGH (ref 70–99)

## 2013-10-21 SURGERY — ARTHROPLASTY, KNEE, TOTAL
Anesthesia: General | Site: Knee | Laterality: Right

## 2013-10-21 MED ORDER — CEFAZOLIN SODIUM-DEXTROSE 2-3 GM-% IV SOLR
INTRAVENOUS | Status: AC
  Filled 2013-10-21: qty 50

## 2013-10-21 MED ORDER — HYDROMORPHONE HCL PF 2 MG/ML IJ SOLN
INTRAMUSCULAR | Status: AC
  Filled 2013-10-21: qty 1

## 2013-10-21 MED ORDER — TEMAZEPAM 7.5 MG PO CAPS: 22.5000 mg | ORAL_CAPSULE | Freq: Every evening | ORAL | Status: DC | PRN

## 2013-10-21 MED ORDER — FENTANYL CITRATE 0.05 MG/ML IJ SOLN
25.0000 ug | INTRAMUSCULAR | Status: DC | PRN
  Administered 2013-10-21: 25 ug via INTRAVENOUS

## 2013-10-21 MED ORDER — VANCOMYCIN HCL 10 G IV SOLR
1500.0000 mg | Freq: Two times a day (BID) | INTRAVENOUS | Status: AC
  Administered 2013-10-21: 1500 mg via INTRAVENOUS
  Filled 2013-10-21: qty 1500

## 2013-10-21 MED ORDER — FENTANYL CITRATE 0.05 MG/ML IJ SOLN
INTRAMUSCULAR | Status: DC | PRN
  Administered 2013-10-21: 50 ug via INTRAVENOUS
  Administered 2013-10-21 (×2): 100 ug via INTRAVENOUS

## 2013-10-21 MED ORDER — DOCUSATE SODIUM 100 MG PO CAPS
100.0000 mg | ORAL_CAPSULE | Freq: Two times a day (BID) | ORAL | Status: DC
  Administered 2013-10-21 – 2013-10-25 (×8): 100 mg via ORAL

## 2013-10-21 MED ORDER — FENTANYL CITRATE 0.05 MG/ML IJ SOLN
INTRAMUSCULAR | Status: AC
  Filled 2013-10-21: qty 2

## 2013-10-21 MED ORDER — CEFAZOLIN SODIUM-DEXTROSE 2-3 GM-% IV SOLR
2.0000 g | INTRAVENOUS | Status: AC
  Administered 2013-10-21: 2 g via INTRAVENOUS

## 2013-10-21 MED ORDER — CEFAZOLIN SODIUM-DEXTROSE 2-3 GM-% IV SOLR
2.0000 g | Freq: Three times a day (TID) | INTRAVENOUS | Status: AC
  Administered 2013-10-21 (×2): 2 g via INTRAVENOUS
  Filled 2013-10-21 (×2): qty 50

## 2013-10-21 MED ORDER — METHOCARBAMOL 1000 MG/10ML IJ SOLN
500.0000 mg | Freq: Four times a day (QID) | INTRAVENOUS | Status: DC | PRN
  Administered 2013-10-21: 500 mg via INTRAVENOUS
  Filled 2013-10-21: qty 5

## 2013-10-21 MED ORDER — SODIUM CHLORIDE 0.45 % IV SOLN
INTRAVENOUS | Status: AC
  Administered 2013-10-21 – 2013-10-23 (×3): via INTRAVENOUS

## 2013-10-21 MED ORDER — OXYBUTYNIN CHLORIDE ER 10 MG PO TB24
10.0000 mg | ORAL_TABLET | Freq: Every day | ORAL | Status: DC
  Administered 2013-10-21 – 2013-10-24 (×4): 10 mg via ORAL
  Filled 2013-10-21 (×5): qty 1

## 2013-10-21 MED ORDER — ONDANSETRON HCL 4 MG/2ML IJ SOLN
INTRAMUSCULAR | Status: DC | PRN
  Administered 2013-10-21: 4 mg via INTRAVENOUS

## 2013-10-21 MED ORDER — HYDROMORPHONE HCL PF 1 MG/ML IJ SOLN
INTRAMUSCULAR | Status: DC | PRN
  Administered 2013-10-21 (×2): 1 mg via INTRAVENOUS

## 2013-10-21 MED ORDER — DOCUSATE SODIUM 100 MG PO CAPS: 100.0000 mg | ORAL_CAPSULE | Freq: Two times a day (BID) | ORAL | Status: DC | PRN

## 2013-10-21 MED ORDER — CISATRACURIUM BESYLATE (PF) 10 MG/5ML IV SOLN
INTRAVENOUS | Status: DC | PRN
  Administered 2013-10-21: 6 mg via INTRAVENOUS

## 2013-10-21 MED ORDER — PROPOFOL 10 MG/ML IV BOLUS
INTRAVENOUS | Status: DC | PRN
  Administered 2013-10-21: 160 mg via INTRAVENOUS
  Administered 2013-10-21: 40 mg via INTRAVENOUS

## 2013-10-21 MED ORDER — SODIUM CHLORIDE 0.9 % IR SOLN
Status: AC
  Filled 2013-10-21: qty 1

## 2013-10-21 MED ORDER — RIVAROXABAN 10 MG PO TABS: 10.0000 mg | ORAL_TABLET | Freq: Every day | ORAL | Status: DC

## 2013-10-21 MED ORDER — GLYCOPYRROLATE 0.2 MG/ML IJ SOLN
INTRAMUSCULAR | Status: AC
  Filled 2013-10-21: qty 2

## 2013-10-21 MED ORDER — BUPIVACAINE LIPOSOME 1.3 % IJ SUSP
20.0000 mL | Freq: Once | INTRAMUSCULAR | Status: AC
  Administered 2013-10-21: 20 mL
  Filled 2013-10-21: qty 20

## 2013-10-21 MED ORDER — SUCCINYLCHOLINE CHLORIDE 20 MG/ML IJ SOLN
INTRAMUSCULAR | Status: DC | PRN
  Administered 2013-10-21: 100 mg via INTRAVENOUS

## 2013-10-21 MED ORDER — SODIUM CHLORIDE 0.9 % IR SOLN
Freq: Once | Status: DC
  Filled 2013-10-21: qty 1

## 2013-10-21 MED ORDER — SODIUM CHLORIDE 0.9 % IR SOLN
Status: DC | PRN
  Administered 2013-10-21: 1000 mL

## 2013-10-21 MED ORDER — METOCLOPRAMIDE HCL 5 MG/ML IJ SOLN: 5.0000 mg | Freq: Three times a day (TID) | INTRAMUSCULAR | Status: DC | PRN

## 2013-10-21 MED ORDER — PHENOL 1.4 % MT LIQD: 1.0000 | OROMUCOSAL | Status: DC | PRN

## 2013-10-21 MED ORDER — FENTANYL CITRATE 0.05 MG/ML IJ SOLN
INTRAMUSCULAR | Status: AC
  Filled 2013-10-21: qty 5

## 2013-10-21 MED ORDER — MIDAZOLAM HCL 2 MG/2ML IJ SOLN
INTRAMUSCULAR | Status: AC
  Filled 2013-10-21: qty 2

## 2013-10-21 MED ORDER — LEVOTHYROXINE SODIUM 100 MCG PO TABS
100.0000 ug | ORAL_TABLET | Freq: Every day | ORAL | Status: DC
  Administered 2013-10-22 – 2013-10-25 (×4): 100 ug via ORAL
  Filled 2013-10-21 (×5): qty 1

## 2013-10-21 MED ORDER — OXYCODONE-ACETAMINOPHEN 7.5-325 MG PO TABS: 1.0000 | ORAL_TABLET | ORAL | Status: DC | PRN

## 2013-10-21 MED ORDER — METOCLOPRAMIDE HCL 10 MG PO TABS: 5.0000 mg | ORAL_TABLET | Freq: Three times a day (TID) | ORAL | Status: DC | PRN

## 2013-10-21 MED ORDER — MEPERIDINE HCL 50 MG/ML IJ SOLN: 6.2500 mg | INTRAMUSCULAR | Status: DC | PRN

## 2013-10-21 MED ORDER — NEOMYCIN-POLYMYXIN B GU 40-200000 IR SOLN: Status: DC | PRN

## 2013-10-21 MED ORDER — MIDAZOLAM HCL 5 MG/5ML IJ SOLN
INTRAMUSCULAR | Status: DC | PRN
  Administered 2013-10-21: 2 mg via INTRAVENOUS

## 2013-10-21 MED ORDER — HYDROMORPHONE HCL PF 1 MG/ML IJ SOLN
1.0000 mg | INTRAMUSCULAR | Status: DC | PRN
  Administered 2013-10-21 – 2013-10-23 (×12): 1 mg via INTRAVENOUS
  Filled 2013-10-21 (×12): qty 1

## 2013-10-21 MED ORDER — METHOCARBAMOL 500 MG PO TABS
500.0000 mg | ORAL_TABLET | Freq: Four times a day (QID) | ORAL | Status: DC | PRN
  Administered 2013-10-21 – 2013-10-24 (×8): 500 mg via ORAL
  Filled 2013-10-21 (×8): qty 1

## 2013-10-21 MED ORDER — OXYCODONE HCL 5 MG PO TABS
5.0000 mg | ORAL_TABLET | ORAL | Status: DC | PRN
  Administered 2013-10-21: 5 mg via ORAL
  Administered 2013-10-22 – 2013-10-24 (×17): 10 mg via ORAL
  Administered 2013-10-25: 5 mg via ORAL
  Administered 2013-10-25: 10 mg via ORAL
  Filled 2013-10-21 (×17): qty 2
  Filled 2013-10-21: qty 1
  Filled 2013-10-21 (×2): qty 2

## 2013-10-21 MED ORDER — ONDANSETRON HCL 4 MG/2ML IJ SOLN
INTRAMUSCULAR | Status: AC
  Filled 2013-10-21: qty 2

## 2013-10-21 MED ORDER — ONDANSETRON HCL 4 MG PO TABS: 4.0000 mg | ORAL_TABLET | Freq: Four times a day (QID) | ORAL | Status: DC | PRN

## 2013-10-21 MED ORDER — DESMOPRESSIN ACETATE 0.2 MG PO TABS
0.2000 mg | ORAL_TABLET | Freq: Every day | ORAL | Status: DC
  Administered 2013-10-21 – 2013-10-24 (×4): 0.2 mg via ORAL
  Filled 2013-10-21 (×5): qty 1

## 2013-10-21 MED ORDER — VANCOMYCIN HCL 10 G IV SOLR
1500.0000 mg | INTRAVENOUS | Status: AC
  Administered 2013-10-21: 1500 mg via INTRAVENOUS
  Filled 2013-10-21: qty 1500

## 2013-10-21 MED ORDER — RIVAROXABAN 10 MG PO TABS
10.0000 mg | ORAL_TABLET | Freq: Every day | ORAL | Status: DC
  Administered 2013-10-22 – 2013-10-25 (×4): 10 mg via ORAL
  Filled 2013-10-21 (×5): qty 1

## 2013-10-21 MED ORDER — ONDANSETRON HCL 4 MG/2ML IJ SOLN: 4.0000 mg | Freq: Four times a day (QID) | INTRAMUSCULAR | Status: DC | PRN

## 2013-10-21 MED ORDER — GLYCOPYRROLATE 0.2 MG/ML IJ SOLN
INTRAMUSCULAR | Status: DC | PRN
  Administered 2013-10-21: .4 mg via INTRAVENOUS

## 2013-10-21 MED ORDER — ACETAMINOPHEN 650 MG RE SUPP: 650.0000 mg | Freq: Four times a day (QID) | RECTAL | Status: DC | PRN

## 2013-10-21 MED ORDER — NEOSTIGMINE METHYLSULFATE 10 MG/10ML IV SOLN
INTRAVENOUS | Status: DC | PRN
  Administered 2013-10-21: 3 mg via INTRAVENOUS

## 2013-10-21 MED ORDER — PROMETHAZINE HCL 25 MG/ML IJ SOLN: 6.2500 mg | INTRAMUSCULAR | Status: DC | PRN

## 2013-10-21 MED ORDER — LACTATED RINGERS IV SOLN: INTRAVENOUS | Status: DC

## 2013-10-21 MED ORDER — EPHEDRINE SULFATE 50 MG/ML IJ SOLN
INTRAMUSCULAR | Status: AC
  Filled 2013-10-21: qty 1

## 2013-10-21 MED ORDER — MENTHOL 3 MG MT LOZG: 1.0000 | LOZENGE | OROMUCOSAL | Status: DC | PRN

## 2013-10-21 MED ORDER — LACTATED RINGERS IV SOLN
INTRAVENOUS | Status: DC
  Administered 2013-10-21: 12:00:00 via INTRAVENOUS
  Administered 2013-10-21: 1000 mL via INTRAVENOUS

## 2013-10-21 MED ORDER — NEOSTIGMINE METHYLSULFATE 10 MG/10ML IV SOLN
INTRAVENOUS | Status: AC
  Filled 2013-10-21: qty 1

## 2013-10-21 MED ORDER — ACETAMINOPHEN 325 MG PO TABS: 650.0000 mg | ORAL_TABLET | Freq: Four times a day (QID) | ORAL | Status: DC | PRN

## 2013-10-21 MED ORDER — DOXAZOSIN MESYLATE 4 MG PO TABS
4.0000 mg | ORAL_TABLET | Freq: Every day | ORAL | Status: DC
  Administered 2013-10-21 – 2013-10-24 (×4): 4 mg via ORAL
  Filled 2013-10-21 (×5): qty 1

## 2013-10-21 MED ORDER — EPHEDRINE SULFATE 50 MG/ML IJ SOLN
INTRAMUSCULAR | Status: DC | PRN
  Administered 2013-10-21 (×2): 10 mg via INTRAVENOUS

## 2013-10-21 MED ORDER — PROPOFOL 10 MG/ML IV BOLUS
INTRAVENOUS | Status: AC
  Filled 2013-10-21: qty 20

## 2013-10-21 SURGICAL SUPPLY — 69 items
BAG ZIPLOCK 12X15 (MISCELLANEOUS) IMPLANT
BANDAGE ELASTIC 4 VELCRO ST LF (GAUZE/BANDAGES/DRESSINGS) ×2 IMPLANT
BANDAGE ELASTIC 6 VELCRO ST LF (GAUZE/BANDAGES/DRESSINGS) ×2 IMPLANT
BANDAGE ESMARK 6X9 LF (GAUZE/BANDAGES/DRESSINGS) ×1 IMPLANT
BLADE SAG 18X100X1.27 (BLADE) ×2 IMPLANT
BLADE SAW SGTL 13.0X1.19X90.0M (BLADE) ×2 IMPLANT
BNDG ESMARK 6X9 LF (GAUZE/BANDAGES/DRESSINGS) ×2
CAPT RP KNEE ×2 IMPLANT
CEMENT HV SMART SET (Cement) ×4 IMPLANT
CHLORAPREP W/TINT 26ML (MISCELLANEOUS) IMPLANT
CLOTH 2% CHLOROHEXIDINE 3PK (PERSONAL CARE ITEMS) ×2 IMPLANT
CUFF TOURN SGL QUICK 34 (TOURNIQUET CUFF) ×1
CUFF TRNQT CYL 34X4X40X1 (TOURNIQUET CUFF) ×1 IMPLANT
DRAPE INCISE IOBAN 66X45 STRL (DRAPES) IMPLANT
DRAPE LG THREE QUARTER DISP (DRAPES) ×2 IMPLANT
DRAPE ORTHO SPLIT 77X108 STRL (DRAPES) ×2
DRAPE POUCH INSTRU U-SHP 10X18 (DRAPES) ×2 IMPLANT
DRAPE SURG ORHT 6 SPLT 77X108 (DRAPES) ×2 IMPLANT
DRAPE U-SHAPE 47X51 STRL (DRAPES) ×2 IMPLANT
DRSG ADAPTIC 3X8 NADH LF (GAUZE/BANDAGES/DRESSINGS) IMPLANT
DRSG AQUACEL AG ADV 3.5X10 (GAUZE/BANDAGES/DRESSINGS) ×2 IMPLANT
DRSG OPSITE POSTOP 3X4 (GAUZE/BANDAGES/DRESSINGS) ×2 IMPLANT
DRSG PAD ABDOMINAL 8X10 ST (GAUZE/BANDAGES/DRESSINGS) IMPLANT
DRSG TEGADERM 4X4.75 (GAUZE/BANDAGES/DRESSINGS) IMPLANT
DURAPREP 26ML APPLICATOR (WOUND CARE) ×2 IMPLANT
ELECT REM PT RETURN 9FT ADLT (ELECTROSURGICAL) ×2
ELECTRODE REM PT RTRN 9FT ADLT (ELECTROSURGICAL) ×1 IMPLANT
EVACUATOR 1/8 PVC DRAIN (DRAIN) ×2 IMPLANT
FACESHIELD WRAPAROUND (MASK) ×10 IMPLANT
GAUZE SPONGE 2X2 8PLY STRL LF (GAUZE/BANDAGES/DRESSINGS) ×1 IMPLANT
GAUZE SPONGE 4X4 12PLY STRL (GAUZE/BANDAGES/DRESSINGS) IMPLANT
GLOVE BIOGEL PI IND STRL 7.5 (GLOVE) ×1 IMPLANT
GLOVE BIOGEL PI IND STRL 8 (GLOVE) ×1 IMPLANT
GLOVE BIOGEL PI INDICATOR 7.5 (GLOVE) ×1
GLOVE BIOGEL PI INDICATOR 8 (GLOVE) ×1
GLOVE SURG SS PI 7.5 STRL IVOR (GLOVE) ×2 IMPLANT
GLOVE SURG SS PI 8.0 STRL IVOR (GLOVE) ×4 IMPLANT
GOWN STRL REUS W/TWL XL LVL3 (GOWN DISPOSABLE) ×4 IMPLANT
HANDPIECE INTERPULSE COAX TIP (DISPOSABLE) ×1
IMMOBILIZER KNEE 20 (SOFTGOODS) ×4 IMPLANT
IMMOBILIZER KNEE 20 THIGH 36 (SOFTGOODS) ×1 IMPLANT
KIT BASIN OR (CUSTOM PROCEDURE TRAY) ×2 IMPLANT
MANIFOLD NEPTUNE II (INSTRUMENTS) ×2 IMPLANT
NDL SAFETY ECLIPSE 18X1.5 (NEEDLE) ×1 IMPLANT
NEEDLE HYPO 18GX1.5 SHARP (NEEDLE) ×1
NS IRRIG 1000ML POUR BTL (IV SOLUTION) IMPLANT
PACK TOTAL JOINT (CUSTOM PROCEDURE TRAY) ×2 IMPLANT
PADDING CAST COTTON 6X4 STRL (CAST SUPPLIES) IMPLANT
POSITIONER SURGICAL ARM (MISCELLANEOUS) ×2 IMPLANT
SET HNDPC FAN SPRY TIP SCT (DISPOSABLE) ×1 IMPLANT
SPONGE GAUZE 2X2 STER 10/PKG (GAUZE/BANDAGES/DRESSINGS) ×1
SPONGE SURGIFOAM ABS GEL 100 (HEMOSTASIS) IMPLANT
STAPLER VISISTAT (STAPLE) IMPLANT
STRIP CLOSURE SKIN 1/2X4 (GAUZE/BANDAGES/DRESSINGS) IMPLANT
SUCTION FRAZIER 12FR DISP (SUCTIONS) ×2 IMPLANT
SUT BONE WAX W31G (SUTURE) IMPLANT
SUT MNCRL AB 4-0 PS2 18 (SUTURE) IMPLANT
SUT VIC AB 1 CT1 27 (SUTURE) ×1
SUT VIC AB 1 CT1 27XBRD ANTBC (SUTURE) ×1 IMPLANT
SUT VIC AB 2-0 CT1 27 (SUTURE) ×3
SUT VIC AB 2-0 CT1 TAPERPNT 27 (SUTURE) ×3 IMPLANT
SUT VLOC 180 0 24IN GS25 (SUTURE) ×2 IMPLANT
SYRINGE 20CC LL (MISCELLANEOUS) ×2 IMPLANT
TOWEL OR 17X26 10 PK STRL BLUE (TOWEL DISPOSABLE) ×2 IMPLANT
TOWEL OR NON WOVEN STRL DISP B (DISPOSABLE) IMPLANT
TOWER CARTRIDGE SMART MIX (DISPOSABLE) ×2 IMPLANT
TRAY FOLEY METER SIL LF 16FR (CATHETERS) ×2 IMPLANT
WATER STERILE IRR 1500ML POUR (IV SOLUTION) ×2 IMPLANT
WRAP KNEE MAXI GEL POST OP (GAUZE/BANDAGES/DRESSINGS) ×2 IMPLANT

## 2013-10-21 NOTE — Anesthesia Postprocedure Evaluation (Signed)
  Anesthesia Post-op Note  Patient: [name]  Procedure(s) Performed: Procedure(s) (LRB): TOTAL RIGHT  KNEE ARTHROPLASTY (Right)  Patient Location: PACU  Anesthesia Type: General  Level of Consciousness: awake and alert   Airway and Oxygen Therapy: Patient Spontanous Breathing  Post-op Pain: mild  Post-op Assessment: Post-op Vital signs reviewed, Patient's Cardiovascular Status Stable, Respiratory Function Stable, Patent Airway and No signs of Nausea or vomiting  Last Vitals:  Filed Vitals:   10/21/13 1315  BP: 142/82  Pulse: 73  Temp:   Resp: 11    Post-op Vital Signs: stable   Complications: No apparent anesthesia complications

## 2013-10-21 NOTE — Progress Notes (Signed)
PT Cancellation Note  Patient Details Name: [name] MRN: [id number] DOB: 01/07/1941   Cancelled Treatment:     POD 0 eval attempted but deferred at request of pt 2* pain level.  Will follow in am   Nekisha Mcdiarmid 10/21/2013, 3:42 PM

## 2013-10-21 NOTE — Anesthesia Preprocedure Evaluation (Signed)
Anesthesia Evaluation  Patient identified by MRN, date of birth, ID band Patient awake    Reviewed: Allergy & Precautions, H&P , NPO status , Patient's Chart, lab work & pertinent test results  Airway Mallampati: II TM Distance: >3 FB Neck ROM: Full    Dental no notable dental hx.    Pulmonary neg pulmonary ROS,  breath sounds clear to auscultation  Pulmonary exam normal       Cardiovascular hypertension, Pt. on medications Rhythm:Regular Rate:Normal     Neuro/Psych negative neurological ROS  negative psych ROS   GI/Hepatic negative GI ROS, Neg liver ROS,   Endo/Other  diabetes  Renal/GU Renal InsufficiencyRenal disease  negative genitourinary   Musculoskeletal negative musculoskeletal ROS (+)   Abdominal   Peds negative pediatric ROS (+)  Hematology negative hematology ROS (+)   Anesthesia Other Findings   Reproductive/Obstetrics negative OB ROS                           Anesthesia Physical  Anesthesia Plan  ASA: II  Anesthesia Plan: General   Post-op Pain Management:    Induction: Intravenous  Airway Management Planned: Oral ETT  Additional Equipment:   Intra-op Plan:   Post-operative Plan: Extubation in OR  Informed Consent: I have reviewed the patients History and Physical, chart, labs and discussed the procedure including the risks, benefits and alternatives for the proposed anesthesia with the patient or authorized representative who has indicated his/her understanding and acceptance.   Dental advisory given  Plan Discussed with: CRNA  Anesthesia Plan Comments:         Anesthesia Quick Evaluation

## 2013-10-21 NOTE — H&P (View-Only) (Signed)
[name] DOB: [birth year] Single / Language: Cleophus Molt / Race: White Male  H&P date 10/12/13  Chief complaint: Right knee pain  History of Present Illness  The patient is a 73 year old male who comes in today for a preoperative history and physical. The patient is scheduled for a right total knee arthroplasty to be performed by Dr. Johnn Hai, MD at [facility] on October 21, 2013. Pt with progressively worsening R knee arthritic symptoms, worse in the last 6 months following knee replacement on the left, refractory to recent steroid injection (depomedrol), bracing, activity modifications, PT/HEP, relative rest, pain medications. At this point given the collapse of his joint space, varus deformity, bone on bone changes, and ongoing pain, recommend proceeding with total knee replacement on the right. Discussed the procedure itself as well as risks, complications and alternatives, including but not limited to DVT, PE, infx, bleeding, failure of procedure, need for secondary procedure including manipulation, nerve injury, ongoing pain/symptoms, anesthesia risk, even stroke or death. Also discussed typical post-op protocols, activity restrictions, need for PT, flexion/extension exercises, time out of work. Discussed need for DVT ppx post-op with Xarelto then ASA per protocol. Discussed dental ppx. Also discussed limitations post-operatively such as kneeling and squatting. All questions were answered. Patient desires to proceed with surgery. He has been cleared by his PCP. 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  Allergies  No Known Drug Allergies09/30/2014  Family History  Cerebrovascular Accident grandfather fathers side Chronic Obstructive Lung Disease grandmother fathers side Congestive Heart Failure grandmother mothers side Heart Disease grandmother mothers side and grandfather mothers  side Hypertension mother Rheumatoid Arthritis child Cancer First Degree Relatives. mother and father First Degree Relatives  Social History  Tobacco use Never smoker. 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 Oxybutynin Chloride (5MG  Tablet, Oral) Active. Doxazosin Mesylate (4MG  Tablet, Oral) Active. Losartan Potassium (25MG  Tablet, Oral) Active. Desmopressin Acetate (0.1MG  Tablet, Oral) Active. Percocet (5-325MG  Tablet, Oral) Active. Aspirin (81MG  Tablet, 1 (one) Oral) Active. Medications Reconciled  Past Surgical History Enucleation of Eyeball-Left 1974; CA Other Surgery Tendon Biceps Repair, Right Arm. Intestinal blockage. Vasectomy Tonsillectomy Spinal Surgery Colon Polyp Removal - Colonoscopy Arthroscopy of Shoulder left Total Knee Replacement - Left Dr. Tonita Cong 04/2013  Past Medical Hx Prostate Disease Kidney Stone Hypothyroidism Hypercholesterolemia High blood pressure Chronic Pain Cancer L eye, 1974 Impaired Vision Arthritis Diabetes Mellitus, Type II diet controlled Chronic Renal Failure Syndrome stage 2 Tinnitus Impaired Hearing Rheumatoid Arthritis Impaired Memory Pneumonia Staphlococcus Infections hx + MRSA  Review of Systems General Present- Fatigue. Not Present- Chills, Fever, Memory Loss, Night Sweats, Weight Gain and Weight Loss. Skin Not Present- Eczema, Hives,  Itching, Lesions and Rash. HEENT Present- Hearing Loss and Tinnitus. Not Present- Dentures, Double Vision, Headache and Visual Loss. Respiratory Present- Cough. Not Present- Allergies, Chronic Cough, Coughing up blood, Shortness of breath at rest and Shortness of breath with exertion. Cardiovascular Not Present- Chest Pain, Difficulty Breathing Lying Down, Murmur, Palpitations, Racing/skipping heartbeats and Swelling.  Gastrointestinal Present- Difficulty Swallowing. Not Present- Abdominal Pain, Bloody Stool, Constipation, Diarrhea, Heartburn, Jaundice, Loss of appetitie, Nausea and Vomiting. Male Genitourinary Present- Urinary frequency, Urinating at Night and Weak urinary stream. Not Present- Blood in Urine, Discharge, Flank Pain, Incontinence, Painful Urination, Urgency and Urinary Retention. Musculoskeletal Present- Joint Pain and Muscle Weakness. Not Present- Back Pain, Joint Swelling, Morning Stiffness, Muscle Pain and Spasms. Neurological Not Present- Blackout spells, Difficulty with balance, Dizziness, Paralysis, Tremor and Weakness. Psychiatric Not Present- Insomnia.  Physical Exam  General Mental Status -Alert, cooperative and good historian. General Appearance-pleasant, Not in acute distress. Orientation-Oriented X3. Build & Nutrition-Well nourished and Well developed. Gait-Stiff and Antalgic.  Head and Neck Head-normocephalic, atraumatic . Neck Global Assessment - supple, no bruit auscultated on the right, no bruit auscultated on the left.  Eye Pupil - Bilateral-Regular and Round. Motion - Bilateral-EOMI.  Chest and Lung Exam Auscultation Breath sounds - clear at anterior chest wall and clear at posterior chest wall. Adventitious sounds - No Adventitious sounds.  Cardiovascular Auscultation Rhythm - Regular rate and rhythm. Heart Sounds - S1 WNL and S2 WNL. Murmurs & Other Heart Sounds - Auscultation of the heart reveals - No  Murmurs.  Abdomen Palpation/Percussion Tenderness - Abdomen is non-tender to palpation. Rigidity (guarding) - Abdomen is soft. Auscultation Auscultation of the abdomen reveals - Bowel sounds normal.  Male Genitourinary Note: Not done, not pertinent to present illness  Musculoskeletal Note: Right Knee: Inspection and Palpation - Tenderness - medial joint line tender to palpation, no tenderness to palpation of the superior calf, no tenderness to palpation of the pes anserine bursa, no tenderness to palpation of the quadriceps tendon, no tenderness to palpation of the patellar tendon, no tenderness to palpation of the patella, no tenderness to palpation of the lateral joint line, no tenderness to palpation of the fibular head, no tenderness to palpation of the peroneal nerve. Patellar Tendon - no pain to palpation of the patellar tendon. Swelling - periarticular swelling present. Effusion - trace. Tissue tension/texture is - soft. Crepitus - mild patellofemoral crepitus. Pulses - 2+. Sensation - intact to light touch. Skin - Color - no ecchymosis, no erythema. Strength and Tone - Quadriceps - 5/5. Hamstrings - 5/5. ROM: Flexion - AROM - 100 . Extension - AROM - 0 . Stability - Valgus Laxity at 30 - None. Valgus Laxity at 0 - None. Varus Laxity at 30 - None. Varus Laxity at 0 - None. Note: painful. Lachman - Negative. Anterior Drawer Test - Negative. Posterior Drawer Test - Negative. Right Knee - Deformities/Malalignments/Discrepancies - no deformities noted. Special Tests - McMurray Test (lateral) - negative. McMurray Test (medial) - negative. Patellar Compression Pain - mild pain.  Imaging Prior xrays reviewed. Right knee with severe medial and PF degenerative changes, bone-on-bone.  Assessment & Plan  Right knee DJD  Pt with R knee DJD, end-stage, bone-on-bone, ongoing pain refractory to steroid injections, quad strengthening, bracing, activity modifications, relative rest, pain  medications, PT. He is scheduled for Right total knee replacement on 8/20 by Dr. Tonita Cong. He has been cleared by his PCP. We discussed again the procedure itself as well as risks, complications and alternatives, including but not limited to DVT, PE, infx, bleeding, failure of procedure, need for secondary procedure including manipulation, nerve injury, ongoing pain/symptoms, anesthesia risk, even stroke or death. Also discussed typical post-op protocols, activity restrictions, need for PT, flexion/extension exercises, time out of work. Discussed need for DVT ppx post-op with Xarelto then ASA per protocol. Discussed dental  ppx. Also discussed limitations post-operatively such as kneeling and squatting. All questions were answered. Patient desires to proceed with surgery. He will remain NPO after MN the night before surgery, hold medications accordingly. Plan Percocet 7.5mg  on D/C from hospital, Xarelto for DVT ppx. Plan kefzol and vanco pre-op with his prior hx of MRSA. [facility] app[facility] is later this week. Plan to D/C to rehab post-op at [facility] in [place] where there is a CPM machine he will be able to use to help with his PT. He will follow up 10-14 days post-op for staple removal and xrays and will call with any questions or concerns in the interim.  Signed electronically by Lacie Draft, PA-C for Dr. Tonita Cong

## 2013-10-21 NOTE — Interval H&P Note (Signed)
History and Physical Interval Note:  10/21/2013 7:30 AM  [name]  has presented today for surgery, with the diagnosis of right knee djd   The various methods of treatment have been discussed with the patient and family. After consideration of risks, benefits and other options for treatment, the patient has consented to  Procedure(s): TOTAL RIGHT  KNEE ARTHROPLASTY (Right) as a surgical intervention .  The patient's history has been reviewed, patient examined, no change in status, stable for surgery.  I have reviewed the patient's chart and labs.  Questions were answered to the patient's satisfaction.     Adelard Sanon C

## 2013-10-21 NOTE — Brief Op Note (Signed)
10/21/2013  12:09 PM  PATIENT:  [name]  73 y.o. male  PRE-OPERATIVE DIAGNOSIS:  right knee degenerative joint disease  POST-OPERATIVE DIAGNOSIS:  right knee degenerative joint disease  PROCEDURE:  Procedure(s): TOTAL RIGHT  KNEE ARTHROPLASTY (Right)  SURGEON:  Surgeon(s) and Role:    * Johnn Hai, MD - Primary  PHYSICIAN ASSISTANT:   ASSISTANTSMancel Bale   ANESTHESIA:   general  EBL:  Total I/O In: 1000 [I.V.:1000] Out: 300 [Urine:300]  BLOOD ADMINISTERED:none  DRAINS: none   LOCAL MEDICATIONS USED:  MARCAINE     SPECIMEN:  No Specimen  DISPOSITION OF SPECIMEN:  N/A  COUNTS:  YES  TOURNIQUET:   Total Tourniquet Time Documented: Thigh (Right) - 79 minutes Total: Thigh (Right) - 79 minutes   DICTATION: .Other Dictation: Dictation Number [id number]  PLAN OF CARE: Admit to inpatient   PATIENT DISPOSITION:  PACU - hemodynamically stable.   Delay start of Pharmacological VTE agent (>24hrs) due to surgical blood loss or risk of bleeding: no

## 2013-10-21 NOTE — Transfer of Care (Signed)
Immediate Anesthesia Transfer of Care Note  Patient: [name]  Procedure(s) Performed: Procedure(s): TOTAL RIGHT  KNEE ARTHROPLASTY (Right)  Patient Location: PACU  Anesthesia Type:General  Level of Consciousness: sedated  Airway & Oxygen Therapy: Patient Spontanous Breathing and Patient connected to face mask oxygen  Post-op Assessment: Report given to PACU RN  Post vital signs: Reviewed and stable  Complications: No apparent anesthesia complications

## 2013-10-21 NOTE — Interval H&P Note (Signed)
History and Physical Interval Note:  10/21/2013 9:42 AM  [name]  has presented today for surgery, with the diagnosis of right knee djd   The various methods of treatment have been discussed with the patient and family. After consideration of risks, benefits and other options for treatment, the patient has consented to  Procedure(s): TOTAL RIGHT  KNEE ARTHROPLASTY (Right) as a surgical intervention .  The patient's history has been reviewed, patient examined, no change in status, stable for surgery.  I have reviewed the patient's chart and labs.  Questions were answered to the patient's satisfaction.     [name] C

## 2013-10-21 NOTE — Progress Notes (Signed)
Clinical Social Work Department CLINICAL SOCIAL WORK PLACEMENT NOTE 10/21/2013  Patient:  [name], [name]  Account Number:  [id number] Admit date:  10/21/2013  Clinical Social Worker:  Werner Lean, LCSW  Date/time:  10/21/2013 12:00 M  Clinical Social Work is seeking post-discharge placement for this patient at the following level of care:   SKILLED NURSING   (*CSW will update this form in Epic as items are completed)     Patient/family provided with [place] Department of Clinical Social Work's list of facilities offering this level of care within the geographic area requested by the patient (or if unable, by the patient's family).  10/21/2013  Patient/family informed of their freedom to choose among providers that offer the needed level of care, that participate in Medicare, Medicaid or managed care program needed by the patient, have an available bed and are willing to accept the patient.    Patient/family informed of [facility]' ownership interest in [facility], as well as of the fact that they are under no obligation to receive care at this facility.  PASARR submitted to EDS on 10/21/2013 PASARR number received on 10/21/2013  FL2 transmitted to all facilities in geographic area requested by pt/family on  10/21/2013 FL2 transmitted to all facilities within larger geographic area on   Patient informed that his/her managed care company has contracts with or will negotiate with  certain facilities, including the following:     Patient/family informed of bed offers received:   Patient chooses bed at  Physician recommends and patient chooses bed at    Patient to be transferred to  on   Patient to be transferred to facility by  Patient and family notified of transfer on  Name of family member notified:    The following physician request were entered in Epic:   Additional Comments:  Werner Lean LCSW [phone]

## 2013-10-21 NOTE — Op Note (Signed)
[name], [name] NO.:  [id number]  MEDICAL RECORD NO.:  [id number]  LOCATION:  [facility]                         FACILITY:  [facility]  PHYSICIAN:  Susa Day, M.D.    DATE OF BIRTH:  11-15-1940  DATE OF PROCEDURE:  10/21/2013 DATE OF DISCHARGE:                              OPERATIVE REPORT   PREOPERATIVE DIAGNOSIS:  End-stage osteoarthritis of the right knee with varus deformity.  POSTOPERATIVE DIAGNOSIS:  End-stage osteoarthritis of the right knee with varus deformity.  PROCEDURE PERFORMED:  Right total knee arthroplasty.  COMPONENTS UTILIZED:  DePuy rotating platform, 5 femur, 5 tibia, 12.5 insert, 38 patella.  ANESTHESIA:  General.  ASSISTANT:  Joseph B. Mercie Eon., was necessary for throughout the case for holding, patient placement, closure, etc.  TECHNIQUE:  With the patient in supine position and after induction of adequate general anesthesia, 2 g of Kefzol and 1.5 g of vancomycin, right lower extremity was prepped, draped and exsanguinated in usual sterile fashion.  Thigh tourniquet was inflated to 300 mmHg.  Midline incision over the knee was made.  Full thickness flaps were developed. Median parapatellar arthrotomy was performed.  Patella was everted and knee flexed.  Tricompartmental osteoarthrosis bone-on-bone was noted. Osteophytes were removed with the rongeur.  Medial and lateral meniscus, remnants were removed as well as the ACL, cauterized the geniculates, elevated the soft tissue medially, preserving the MCL.  Step drill was utilized to enter the femoral canal, was irrigated, 5-degree right was placed, pinned, 10 mm off the distal femur, was performed.  Sized off the anterior cortex to a 5, pinned and 3 degrees of external rotation. We then performed our anterior, posterior, and chamfer cuts.  Tibia was subluxed.  External alignment guide was used to off the defect, which was medial, slight posterior slope parallel to the shaft,  bisecting the tibiotalar joint, pinned.  Oscillating saw performed the cut.  We had equivalent flexion and extension gaps.  Next, completed the tibia, maximized to a 5 just to the medial aspect of the tibial tubercle, was pinned, centrally drilled, punch guide utilized.  We then performed our box cut on the femur, bisecting the canal.  This was then performed.  We then removed the jig, put a trial femur, tibia with a 10-mm insert with slight hyperextension, full flexion, good stability with varus and valgus stressing, 0-30 degrees.  We then planed the patella from 25 to a 15, drilled our PEG holes after measuring it to a 38.  We had excellent patellofemoral tracking.  Removed all trials, then checked posteriorly, popliteus intact.  Loose body removed.  We cauterized the geniculates. Copiously irrigated with pulsatile lavage and mixed the cement on back table in appropriate fashion.  Flexed the knee, dried all surfaces, subluxed the tibia.  Cement was injected into the proximal tibia, pressurized, impacted the tibial tray, cemented the femur, impacted that.  Placed a 12.5 insert and reduced it and held it with an axial load throughout the curing of the cement.  Redundant cement was removed. We cemented the patella.  After drying the cement, we removed the trial, removed any redundant cement meticulously with osteotome, copiously irrigated the wound with  pulsatile lavage and antibiotic irrigation. Placed a 12.5 insert permanent, reduced it.  Full extension, full flexion, good stability with varus and valgus stressing at 0 and 30 degrees, excellent patellofemoral tracking.  Negative anterior drawer. We used Exparel on the periosteum of the femur and the periarticular tissues.  Hemovac was placed and brought out through a lateral stab wound of the skin.  We repaired the patellar arthrotomy in slight flexion with 1 Vicryl and then V-Loc running.  Subcu with 2-0 and skin with staples.  Flexion  the gravity to 90 degrees, 0 and 140 on range of motion.  Sterile dressing was applied.  Tourniquet was deflated and there was adequate revascularization of lower extremity appreciated.  The patient tolerated the procedure well.  No complications.  Assistant, [name], P.A.  Tourniquet time was 83 minutes.  Minimal blood loss.     Susa Day, M.D.     [name]  D:  10/21/2013  T:  10/21/2013  Job:  [id number]

## 2013-10-21 NOTE — Progress Notes (Signed)
Clinical Social Work Department BRIEF PSYCHOSOCIAL ASSESSMENT 10/21/2013  Patient:  [name], [name]     Account Number:  [id number]     Admit date:  10/21/2013  Clinical Social Worker:  Lacie Scotts  Date/Time:  10/21/2013 04:04 PM  Referred by:  Physician  Date Referred:  10/21/2013 Referred for  SNF Placement   Other Referral:   Interview type:  Patient Other interview type:    PSYCHOSOCIAL DATA Living Status:  ALONE Admitted from facility:   Level of care:   Primary support name:  Rodell Perna Primary support relationship to patient:  CHILD, ADULT Degree of support available:   unclear    CURRENT CONCERNS Current Concerns  Post-Acute Placement   Other Concerns:    SOCIAL WORK ASSESSMENT / PLAN Pt is a 73 yr old gentleman living at home prior to hospitalization. CSW met with pt to assist with d/c planning. This is planned admission. Pt has made prior arrangements to have ST Rehab at [facility] following hospital d/c. CSW has contacted SNF and is waiting for confirmation. CSW will continue to follow to assist with d/c planning.   Assessment/plan status:  Psychosocial Support/Ongoing Assessment of Needs Other assessment/ plan:   Information/referral to community resources:   Insurance coverage for SNF and ambulance transport reviewed.    PATIENT'S/FAMILY'S RESPONSE TO PLAN OF CARE: Pt is pleased surgery is over. His pain is being controlled. Mood is bright and he is looking forward to rehab at [facility].    Werner Lean LCSW [phone]

## 2013-10-22 LAB — CBC
HEMATOCRIT: 37.5 % — AB (ref 39.0–52.0)
Hemoglobin: 12.7 g/dL — ABNORMAL LOW (ref 13.0–17.0)
MCH: 30 pg (ref 26.0–34.0)
MCHC: 33.9 g/dL (ref 30.0–36.0)
MCV: 88.7 fL (ref 78.0–100.0)
PLATELETS: 162 10*3/uL (ref 150–400)
RBC: 4.23 MIL/uL (ref 4.22–5.81)
RDW: 13.3 % (ref 11.5–15.5)
WBC: 8.7 10*3/uL (ref 4.0–10.5)

## 2013-10-22 LAB — BASIC METABOLIC PANEL
ANION GAP: 11 (ref 5–15)
BUN: 20 mg/dL (ref 6–23)
CALCIUM: 8.4 mg/dL (ref 8.4–10.5)
CO2: 24 mEq/L (ref 19–32)
Chloride: 100 mEq/L (ref 96–112)
Creatinine, Ser: 1.36 mg/dL — ABNORMAL HIGH (ref 0.50–1.35)
GFR calc Af Amer: 58 mL/min — ABNORMAL LOW (ref >90)
GFR calc non Af Amer: 50 mL/min — ABNORMAL LOW (ref >90)
Glucose, Bld: 159 mg/dL — ABNORMAL HIGH (ref 70–99)
Potassium: 4.6 mEq/L (ref 3.7–5.3)
Sodium: 135 mEq/L — ABNORMAL LOW (ref 137–147)

## 2013-10-22 LAB — GLUCOSE, CAPILLARY
Glucose-Capillary: 151 mg/dL — ABNORMAL HIGH (ref 70–99)
Glucose-Capillary: 161 mg/dL — ABNORMAL HIGH (ref 70–99)
Glucose-Capillary: 165 mg/dL — ABNORMAL HIGH (ref 70–99)
Glucose-Capillary: 156 mg/dL — ABNORMAL HIGH (ref 70–99)

## 2013-10-22 MED ORDER — ZOLPIDEM TARTRATE 5 MG PO TABS
5.0000 mg | ORAL_TABLET | Freq: Every evening | ORAL | Status: DC | PRN
  Administered 2013-10-22: 5 mg via ORAL
  Filled 2013-10-22: qty 1

## 2013-10-22 NOTE — Care Management Note (Signed)
    Page 1 of 1   10/22/2013     2:10:20 PM CARE MANAGEMENT NOTE 10/22/2013  Patient:  [name], [name]   Account Number:  [id number]  Date Initiated:  10/22/2013  Documentation initiated by:  [facility]  Subjective/Objective Assessment:   74 Y/O M ADMITTED W/R DJD     Action/Plan:   FROM HOME.   Anticipated DC Date:  10/25/2013   Anticipated DC Plan:  [place]  CM consult      Choice offered to / List presented to:             Status of service:  In process, will continue to follow Medicare Important Message given?   (If response is "NO", the following Medicare IM given date fields will be blank) Date Medicare IM given:   Medicare IM given by:   Date Additional Medicare IM given:   Additional Medicare IM given by:    Discharge Disposition:    Per UR Regulation:  Reviewed for med. necessity/level of care/duration of stay  If discussed at [place] of Stay Meetings, dates discussed:    Comments:  10/22/13 Treylin Burtch RN,BSN NCM [email or web address]-SNF.CSW FOLLOWING FOR D/C TO SNF.

## 2013-10-22 NOTE — Progress Notes (Signed)
Physical Therapy Treatment Patient Details Name: [name] MRN: [id number] DOB: December 05, 1940 Today's Date: 10/22/2013    History of Present Illness R TKR - s/p L TKR 2/15    PT Comments    Marked improvement in activity tolerance vs am with improved pain control  Follow Up Recommendations  SNF     Equipment Recommendations  None recommended by PT    Recommendations for Other Services OT consult     Precautions / Restrictions Precautions Precautions: Knee;Fall Required Braces or Orthoses: Knee Immobilizer - Right Knee Immobilizer - Right: Discontinue once straight leg raise with < 10 degree lag Restrictions Weight Bearing Restrictions: No Other Position/Activity Restrictions: WBAT    Mobility  Bed Mobility Overal bed mobility: Needs Assistance Bed Mobility: Sit to Supine     Supine to sit: Mod assist Sit to supine: Mod assist   General bed mobility comments: cues for sequence and use of L LE to self assist  Transfers Overall transfer level: Needs assistance Equipment used: Rolling walker (2 wheeled) Transfers: Sit to/from Stand Sit to Stand: +2 physical assistance;+2 safety/equipment;From elevated surface;Mod assist         General transfer comment: cues for LE management and use of UEs to self assist  Ambulation/Gait Ambulation/Gait assistance: Min assist;Mod assist;+2 safety/equipment Ambulation Distance (Feet): 34 Feet Assistive device: Rolling walker (2 wheeled) Gait Pattern/deviations: Step-to pattern;Decreased step length - right;Decreased step length - left;Shuffle;Trunk flexed Gait velocity: decr   General Gait Details: cues for posture, sequence and position from [employer] Mobility    Modified Rankin (Stroke Patients Only)       Balance                                    Cognition Arousal/Alertness: Awake/alert Behavior During Therapy: WFL for tasks assessed/performed Overall Cognitive  Status: Within Functional Limits for tasks assessed                      Exercises Total Joint Exercises Ankle Circles/Pumps: AROM;15 reps;Both;Supine Quad Sets: AROM;Both;10 reps;Supine Heel Slides: AAROM;15 reps;Supine;Right Straight Leg Raises: AAROM;Right;10 reps;Supine    General Comments        Pertinent Vitals/Pain Pain Assessment: 0-10 Pain Score: 7  Pain Location: R knee Pain Descriptors / Indicators: Aching;Burning;Throbbing Pain Intervention(s): Limited activity within patient's tolerance;Monitored during session;Premedicated before session;Ice applied    Home Living Family/patient expects to be discharged to:: Skilled nursing facility Living Arrangements: Alone                  Prior Function Level of Independence: Independent      Comments: long standing B knee pain limiting activity level    PT Goals (current goals can now be found in the care plan section) Acute Rehab PT Goals Patient Stated Goal: Rehab and home to resume previous lifestyle with decreased pain PT Goal Formulation: With patient Time For Goal Achievement: 10/29/13 Potential to Achieve Goals: Good Progress towards PT goals: Progressing toward goals    Frequency  7X/week    PT Plan Current plan remains appropriate    Co-evaluation             End of Session Equipment Utilized During Treatment: Gait belt;Right knee immobilizer Activity Tolerance: Patient tolerated treatment well Patient left: in bed;with call [place]/phone within reach  Time: 9166-0600 PT Time Calculation (min): 28 min  Charges:  $Gait Training: 8-22 mins $Therapeutic Exercise: 8-22 mins                    G Codes:      Marrie Chandra November 14, 2013, 3:29 PM

## 2013-10-22 NOTE — Discharge Instructions (Signed)
Elevate leg above heart 6x a day for 20minutes each °Use knee immobilizer while walking until can SLR x 10 °Use knee immobilizer in bed to keep knee in extension °Daily dressing changes °Aquacel dressing may remain in place for 7 days. May shower with aquacel dressing in place. After 7 days, remove aquacel dressing. Do not remove steri-strips if they are present. Place new dressing with gauze and tape or ACE bandage which should be kept clean and dry and changed daily. ° °Information on my medicine - XARELTO® (Rivaroxaban) ° °This medication education was reviewed with me or my healthcare representative as part of my discharge preparation.  The pharmacist that spoke with me during my hospital stay was:  Doneen Ollinger R, RPH ° °Why was Xarelto® prescribed for you? °Xarelto® was prescribed for you to reduce the risk of blood clots forming after orthopedic surgery. The medical term for these abnormal blood clots is venous thromboembolism (VTE). ° °What do you need to know about xarelto® ? °Take your Xarelto® ONCE DAILY at the same time every day. °You may take it either with or without food. ° °If you have difficulty swallowing the tablet whole, you may crush it and mix in applesauce just prior to taking your dose. ° °Take Xarelto® exactly as prescribed by your doctor and DO NOT stop taking Xarelto® without talking to the doctor who prescribed the medication.  Stopping without other VTE prevention medication to take the place of Xarelto® may increase your risk of developing a clot. ° °After discharge, you should have regular check-up appointments with your healthcare provider that is prescribing your Xarelto®.   ° °What do you do if you miss a dose? °If you miss a dose, take it as soon as you remember on the same day then continue your regularly scheduled once daily regimen the next day. Do not take two doses of Xarelto® on the same day.  ° °Important Safety Information °A possible side effect of Xarelto® is  bleeding. You should call your healthcare provider right away if you experience any of the following: °  Bleeding from an injury or your nose that does not stop. °  Unusual colored urine (red or dark brown) or unusual colored stools (red or black). °  Unusual bruising for unknown reasons. °  A serious fall or if you hit your head (even if there is no bleeding). ° °Some medicines may interact with Xarelto® and might increase your risk of bleeding while on Xarelto®. To help avoid this, consult your healthcare provider or pharmacist prior to using any new prescription or non-prescription medications, including herbals, vitamins, non-steroidal anti-inflammatory drugs (NSAIDs) and supplements. ° °This website has more information on Xarelto®: www.xarelto.com. ° ° ° °

## 2013-10-22 NOTE — Evaluation (Signed)
Physical Therapy Evaluation Patient Details Name: [name] MRN: [id number] DOB: 12-11-1940 Today's Date: 10/22/2013   History of Present Illness  R TKR - s/p L TKR 2/15  Clinical Impression  Pt s/p R TKR presents with decreased R LE strength/ROM and post op pain limiting functional mobility.  Pt would benefit from follow up rehab at SNF level to maximize IND and safety prior to return home alone.    Follow Up Recommendations SNF    Equipment Recommendations  None recommended by PT    Recommendations for Other Services OT consult     Precautions / Restrictions Precautions Precautions: Knee;Fall Required Braces or Orthoses: Knee Immobilizer - Right Knee Immobilizer - Right: Discontinue once straight leg raise with < 10 degree lag Restrictions Weight Bearing Restrictions: No Other Position/Activity Restrictions: WBAT      Mobility  Bed Mobility Overal bed mobility: Needs Assistance Bed Mobility: Supine to Sit     Supine to sit: Mod assist     General bed mobility comments: cues for sequence and use of L LE to self assist  Transfers Overall transfer level: Needs assistance Equipment used: Rolling walker (2 wheeled) Transfers: Sit to/from Stand Sit to Stand: +2 physical assistance;+2 safety/equipment;From elevated surface;Mod assist         General transfer comment: cues for LE management and use of UEs to self assist  Ambulation/Gait Ambulation/Gait assistance: Min assist;Mod assist;+2 physical assistance Ambulation Distance (Feet): 16 Feet Assistive device: Rolling walker (2 wheeled) Gait Pattern/deviations: Step-to pattern;Decreased step length - right;Decreased step length - left;Shuffle;Trunk flexed Gait velocity: decr   General Gait Details: cues for posture, sequence and position from [employer] Mobility    Modified Rankin (Stroke Patients Only)       Balance                                              Pertinent Vitals/Pain Pain Assessment: 0-10 Pain Score: 8  Pain Location: R knee Pain Descriptors / Indicators: Aching;Squeezing;Throbbing Pain Intervention(s): Limited activity within patient's tolerance;Monitored during session;Premedicated before session;Ice applied    Home Living Family/patient expects to be discharged to:: Skilled nursing facility Living Arrangements: Alone                    Prior Function Level of Independence: Independent         Comments: long standing B knee pain limiting activity level      Hand Dominance   Dominant Hand: Right    Extremity/Trunk Assessment   Upper Extremity Assessment: Overall WFL for tasks assessed           Lower Extremity Assessment: RLE deficits/detail         Communication   Communication: HOH  Cognition Arousal/Alertness: Awake/alert Behavior During Therapy: WFL for tasks assessed/performed Overall Cognitive Status: Within Functional Limits for tasks assessed                      General Comments      Exercises        Assessment/Plan    PT Assessment Patient needs continued PT services  PT Diagnosis Difficulty walking   PT Problem List Decreased strength;Decreased range of motion;Decreased activity tolerance;Decreased mobility;Decreased knowledge of use of DME;Pain  PT Treatment Interventions DME instruction;Gait training;Functional  mobility training;Therapeutic activities;Therapeutic exercise;Patient/family education   PT Goals (Current goals can be found in the Care Plan section) Acute Rehab PT Goals Patient Stated Goal: Rehab and home to resume previous lifestyle with decreased pain PT Goal Formulation: With patient Time For Goal Achievement: 10/29/13 Potential to Achieve Goals: Good    Frequency 7X/week   Barriers to discharge        Co-evaluation               End of Session Equipment Utilized During Treatment: Gait belt;Right knee immobilizer Activity  Tolerance: Patient limited by pain;Patient tolerated treatment well Patient left: in chair;with call [place]/phone within reach;with family/visitor present Nurse Communication: Mobility status         Time: 5993-5701 PT Time Calculation (min): 22 min   Charges:   PT Evaluation $Initial PT Evaluation Tier I: 1 Procedure PT Treatments $Gait Training: 8-22 mins   PT G Codes:          Media Pizzini 10/22/2013, 2:19 PM

## 2013-10-22 NOTE — Progress Notes (Signed)
OT Cancellation Note  Patient Details Name: [name] MRN: [id number] DOB: 1940-03-26   Cancelled Treatment:    Reason Eval/Treat Not Completed: Other (comment) Pt currently working with PT. Will check back.  Jules Schick [phone] 10/22/2013, 11:51 AM

## 2013-10-22 NOTE — Progress Notes (Signed)
CSW assisting with d/c planning. [facility] has provided bed offer. CSW will assist with d/c planning to SNF when stable.  Werner Lean LCSW [phone]

## 2013-10-22 NOTE — Progress Notes (Signed)
Subjective: 1 Day Post-Op Procedure(s) (LRB): TOTAL RIGHT  KNEE ARTHROPLASTY (Right) Patient reports pain as 6 on 0-10 scale.    Objective: Vital signs in last 24 hours: Temp:  [97.4 F (36.3 C)-99.3 F (37.4 C)] 99.3 F (37.4 C) (08/21 0514) Pulse Rate:  [66-83] 77 (08/21 0514) Resp:  [9-20] 20 (08/21 0514) BP: (122-142)/(66-82) 123/66 mmHg (08/21 0514) SpO2:  [94 %-98 %] 96 % (08/21 0514) Weight:  [98.884 kg (218 lb)] 98.884 kg (218 lb) (08/20 1413)  Intake/Output from previous day: 08/20 0701 - 08/21 0700 In: 2771.3 [I.V.:2671.3; IV Piggyback:100] Out: 1812 [Urine:1275; Drains:537] Intake/Output this shift:     Recent Labs  10/22/13 0420  HGB 12.7*    Recent Labs  10/22/13 0420  WBC 8.7  RBC 4.23  HCT 37.5*  PLT 162    Recent Labs  10/22/13 0420  NA 135*  K 4.6  CL 100  CO2 24  BUN 20  CREATININE 1.36*  GLUCOSE 159*  CALCIUM 8.4   No results found for this basename: LABPT, INR,  in the last 72 hours  Neurologically intact Intact pulses distally Compartment soft Hemovac D/C'd tip intact serous dr  Assessment/Plan: 1 Day Post-Op Procedure(s) (LRB): TOTAL RIGHT  KNEE ARTHROPLASTY (Right) Advance diet Up with therapy D/C IV fluids Cr sl increase check tomorrow Elevate Plan D/C Sunday  Zonnique Norkus C 10/22/2013, 7:14 AM

## 2013-10-23 LAB — CBC
HEMATOCRIT: 31.8 % — AB (ref 39.0–52.0)
HEMOGLOBIN: 10.6 g/dL — AB (ref 13.0–17.0)
MCH: 29.4 pg (ref 26.0–34.0)
MCHC: 33.3 g/dL (ref 30.0–36.0)
MCV: 88.3 fL (ref 78.0–100.0)
Platelets: 155 10*3/uL (ref 150–400)
RBC: 3.6 MIL/uL — AB (ref 4.22–5.81)
RDW: 13.1 % (ref 11.5–15.5)
WBC: 8.2 10*3/uL (ref 4.0–10.5)

## 2013-10-23 LAB — GLUCOSE, CAPILLARY
GLUCOSE-CAPILLARY: 149 mg/dL — AB (ref 70–99)
GLUCOSE-CAPILLARY: 138 mg/dL — AB (ref 70–99)
Glucose-Capillary: 150 mg/dL — ABNORMAL HIGH (ref 70–99)
Glucose-Capillary: 160 mg/dL — ABNORMAL HIGH (ref 70–99)

## 2013-10-23 NOTE — Progress Notes (Signed)
Physical Therapy Treatment Patient Details Name: [name] MRN: [id number] DOB: 02-13-1941 Today's Date: 11-08-2013    History of Present Illness R TKR - s/p L TKR 2/15    PT Comments    Motivated and progressing steadily  Follow Up Recommendations  SNF     Equipment Recommendations  None recommended by PT    Recommendations for Other Services OT consult     Precautions / Restrictions Precautions Precautions: Knee;Fall Required Braces or Orthoses: Knee Immobilizer - Right Knee Immobilizer - Right: Discontinue once straight leg raise with < 10 degree lag Restrictions Weight Bearing Restrictions: No Other Position/Activity Restrictions: WBAT    Mobility  Bed Mobility Overal bed mobility: Needs Assistance Bed Mobility: Supine to Sit;Sit to Supine     Supine to sit: Min assist;Mod assist Sit to supine: Min assist   General bed mobility comments: cues for sequence and use of L LE to self assist  Transfers Overall transfer level: Needs assistance Equipment used: Rolling walker (2 wheeled) Transfers: Sit to/from Stand Sit to Stand: From elevated surface;Min assist;Mod assist         General transfer comment: cues for LE management and use of UEs to self assist  Ambulation/Gait Ambulation/Gait assistance: Min assist Ambulation Distance (Feet): 49 Feet (twice) Assistive device: Rolling walker (2 wheeled) Gait Pattern/deviations: Step-to pattern;Decreased step length - right;Decreased step length - left;Shuffle;Trunk flexed Gait velocity: decr   General Gait Details: cues for posture, sequence and position from [employer]            Wheelchair Mobility    Modified Rankin (Stroke Patients Only)       Balance                                    Cognition Arousal/Alertness: Awake/alert Behavior During Therapy: WFL for tasks assessed/performed Overall Cognitive Status: Within Functional Limits for tasks assessed                       Exercises      General Comments        Pertinent Vitals/Pain Pain Assessment: 0-10 Pain Score: 5  Pain Location: R knee Pain Descriptors / Indicators: Aching Pain Intervention(s): Premedicated before session;Limited activity within patient's tolerance;Monitored during session    Home Living                      Prior Function            PT Goals (current goals can now be found in the care plan section) Acute Rehab PT Goals Patient Stated Goal: Rehab and home to resume previous lifestyle with decreased pain PT Goal Formulation: With patient Time For Goal Achievement: 10/29/13 Potential to Achieve Goals: Good Progress towards PT goals: Progressing toward goals    Frequency  7X/week    PT Plan Current plan remains appropriate    Co-evaluation             End of Session Equipment Utilized During Treatment: Gait belt;Right knee immobilizer Activity Tolerance: Patient tolerated treatment well Patient left: in bed;with call Santillanes/phone within reach     Time: 4034-7425 PT Time Calculation (min): 33 min  Charges:  $Gait Training: 23-37 mins                    G Codes:      Caliber Landess 08-Nov-2013, 4:38  PM   

## 2013-10-23 NOTE — Progress Notes (Signed)
Physical Therapy Treatment Patient Details Name: [name] MRN: [id number] DOB: [birth year] Today's Date: 11-17-13    History of Present Illness R TKR - s/p L TKR 2/15    PT Comments      Follow Up Recommendations  SNF     Equipment Recommendations  None recommended by PT    Recommendations for Other Services OT consult     Precautions / Restrictions Precautions Precautions: Knee;Fall Required Braces or Orthoses: Knee Immobilizer - Right Knee Immobilizer - Right: Discontinue once straight leg raise with < 10 degree lag Restrictions Weight Bearing Restrictions: No Other Position/Activity Restrictions: WBAT    Mobility  Bed Mobility               General bed mobility comments: OOB deferred at pt request - pt states just back to bed from bathroom  Transfers                    Ambulation/Gait                 Stairs            Wheelchair Mobility    Modified Rankin (Stroke Patients Only)       Balance                                    Cognition Arousal/Alertness: Awake/alert Behavior During Therapy: WFL for tasks assessed/performed Overall Cognitive Status: Within Functional Limits for tasks assessed                      Exercises Total Joint Exercises Ankle Circles/Pumps: AROM;15 reps;Both;Supine Quad Sets: AROM;Both;Supine;20 reps Heel Slides: AAROM;Supine;Right;20 reps Straight Leg Raises: AAROM;Right;Supine;20 reps Goniometric ROM: AAROM R knee -10 - 70    General Comments        Pertinent Vitals/Pain Pain Assessment: 0-10 Pain Score: 7  Pain Location: R knee Pain Descriptors / Indicators: Aching;Throbbing Pain Intervention(s): Limited activity within patient's tolerance;Monitored during session;Premedicated before session;Ice applied    Home Living                      Prior Function            PT Goals (current goals can now be found in the care plan section) Acute  Rehab PT Goals Patient Stated Goal: Rehab and home to resume previous lifestyle with decreased pain PT Goal Formulation: With patient Time For Goal Achievement: 10/29/13 Potential to Achieve Goals: Good Progress towards PT goals: Progressing toward goals    Frequency  7X/week    PT Plan Current plan remains appropriate    Co-evaluation             End of Session   Activity Tolerance: Patient tolerated treatment well Patient left: in bed;with call Chambers/phone within reach     Time: 1036-1102 PT Time Calculation (min): 26 min  Charges:  $Therapeutic Exercise: 23-37 mins                    G Codes:      [name] 2013-11-17, 11:04 AM

## 2013-10-23 NOTE — Progress Notes (Signed)
   Subjective: 2 Days Post-Op Procedure(s) (LRB): TOTAL RIGHT  KNEE ARTHROPLASTY (Right)  Pt feeling much better today Plan for rehab facility on Monday Overall much improved Patient reports pain as mild.  Objective:   VITALS:   Filed Vitals:   10/23/13 0559  BP: 122/60  Pulse: 78  Temp: 99.4 F (37.4 C)  Resp: 16    Right knee incision appears appropriate nv intact distally No rashes or edema  LABS  Recent Labs  10/22/13 0420 10/23/13 0450  HGB 12.7* 10.6*  HCT 37.5* 31.8*  WBC 8.7 8.2  PLT 162 155     Recent Labs  10/22/13 0420  NA 135*  K 4.6  BUN 20  CREATININE 1.36*  GLUCOSE 159*     Assessment/Plan: 2 Days Post-Op Procedure(s) (LRB): TOTAL RIGHT  KNEE ARTHROPLASTY (Right) Continue PT/OT  Plan for d/c to rehab facility on Monday Pain control as needed   Merla Riches, MPAS, PA-C  10/23/2013, 7:15 AM

## 2013-10-23 NOTE — Progress Notes (Signed)
OT Cancellation Note  Patient Details Name: TARRANCE JANUSZEWSKI MRN: [id number] DOB: 05-07-1940   Cancelled Treatment:    Reason Eval/Treat Not Completed: Fatigue/lethargy limiting ability to participate Pt  Not able to keep eyes open to talk to OT.  Pt had just had pain meds.  Will recheck on pt next day  Pala, Thereasa Parkin 10/23/2013, 12:28 PM

## 2013-10-24 LAB — CBC
HEMATOCRIT: 31.2 % — AB (ref 39.0–52.0)
HEMOGLOBIN: 10.6 g/dL — AB (ref 13.0–17.0)
MCH: 29.7 pg (ref 26.0–34.0)
MCHC: 34 g/dL (ref 30.0–36.0)
MCV: 87.4 fL (ref 78.0–100.0)
Platelets: 172 10*3/uL (ref 150–400)
RBC: 3.57 MIL/uL — AB (ref 4.22–5.81)
RDW: 12.8 % (ref 11.5–15.5)
WBC: 8.1 10*3/uL (ref 4.0–10.5)

## 2013-10-24 LAB — GLUCOSE, CAPILLARY
GLUCOSE-CAPILLARY: 132 mg/dL — AB (ref 70–99)
GLUCOSE-CAPILLARY: 144 mg/dL — AB (ref 70–99)
Glucose-Capillary: 117 mg/dL — ABNORMAL HIGH (ref 70–99)
Glucose-Capillary: 130 mg/dL — ABNORMAL HIGH (ref 70–99)

## 2013-10-24 NOTE — Evaluation (Signed)
Occupational Therapy Evaluation Patient Details Name: [name] MRN: [id number] DOB: August 26, 1940 Today's Date: 10/24/2013    History of Present Illness R TKR - s/p L TKR 2/15   Clinical Impression   Pt is sleepy upon arrival and at end of session but able to participate and have conversation. He performed grooming seated and worked on safety with functional transfers. Will benefit from SNF as he lives alone. Will continue to follow for progressing ADL independence.    Follow Up Recommendations  SNF;Supervision/Assistance - 24 hour    Equipment Recommendations  None recommended by OT    Recommendations for Other Services       Precautions / Restrictions Precautions Precautions: Knee;Fall Required Braces or Orthoses: Knee Immobilizer - Right Knee Immobilizer - Right: Discontinue once straight leg raise with < 10 degree lag Restrictions Weight Bearing Restrictions: No Other Position/Activity Restrictions: WBAT      Mobility Bed Mobility Overal bed mobility: Needs Assistance Bed Mobility: Supine to Sit     Supine to sit: Min assist     General bed mobility comments: assist for R LE off the bed. verbal cues for technique.  Transfers Overall transfer level: Needs assistance Equipment used: Rolling walker (2 wheeled) Transfers: Sit to/from Stand Sit to Stand: Mod assist;Min assist         General transfer comment: verbal cues for hand placement. Needs assist to steady throughout transition and bring trunk to upright.    Balance                                            ADL Overall ADL's : Needs assistance/impaired Eating/Feeding: Independent;Bed level   Grooming: Wash/dry hands;Supervision/safety;Set up;Sitting   Upper Body Bathing: Supervision/ safety;Set up;Sitting   Lower Body Bathing: Moderate assistance;Sit to/from stand   Upper Body Dressing : Set up;Supervision/safety;Sitting   Lower Body Dressing: Moderate assistance;Sit  to/from stand   Toilet Transfer: Minimal assistance;Stand-pivot;RW   Toileting- Clothing Manipulation and Hygiene: Moderate assistance;Sit to/from stand         General ADL Comments: Pt asleep when OT arrived but able to arouse enough to have a conversation and agreeable to OOB to chair. Pt states earlier this am he was confused and thought it was night time and states he thought he was at a "movie theater."  Pt states he think this is from the pain meds. Able to participate with transfer to chair and started to get sleepy again once reclined and comfortable. Pt complaining of indigestion so informed nursing. Pt able to reach down to lower leg but not R foot for LB dressing. He states by the time he finished rehab at [facility] for L knee he didnt require AE for home.      Vision                     Perception     Praxis      Pertinent Vitals/Pain Pain Assessment: 0-10 Pain Score: 10-Worst pain ever (briefly at a 10 but down to 8 with rest) Pain Location: R knee Pain Descriptors / Indicators: Aching Pain Intervention(s): Repositioned;Ice applied     Hand Dominance Right   Extremity/Trunk Assessment Upper Extremity Assessment Upper Extremity Assessment: Overall WFL for tasks assessed           Communication Communication Communication: HOH   Cognition Arousal/Alertness: Suspect due to medications;Lethargic (groggy  at start of session and end) Behavior During Therapy: [facility] for tasks assessed/performed Overall Cognitive Status: Within Functional Limits for tasks assessed                     General Comments       Exercises       Shoulder Instructions      Home Living Family/patient expects to be discharged to:: Skilled nursing facility Living Arrangements: Alone                                      Prior Functioning/Environment Level of Independence: Independent        Comments: long standing B knee pain limiting activity level      OT Diagnosis: Generalized weakness   OT Problem List: Decreased strength;Decreased knowledge of use of DME or AE   OT Treatment/Interventions: Self-care/ADL training;Patient/family education;Therapeutic activities;DME and/or AE instruction    OT Goals(Current goals can be found in the care plan section) Acute Rehab OT Goals Patient Stated Goal: Rehab and home to resume previous lifestyle with decreased pain OT Goal Formulation: With patient Time For Goal Achievement: 10/31/13 Potential to Achieve Goals: Good  OT Frequency: Min 2X/week   Barriers to D/C:            Co-evaluation              End of Session Equipment Utilized During Treatment: Gait belt;Rolling walker;Right knee immobilizer  Activity Tolerance: Patient limited by pain Patient left: in chair;with call Ainsley/phone within reach;with family/visitor present   Time: 9622-2979 OT Time Calculation (min): 22 min Charges:  OT General Charges $OT Visit: 1 Procedure OT Evaluation $Initial OT Evaluation Tier I: 1 Procedure OT Treatments $Therapeutic Activity: 8-22 mins G-Codes:    Jules Schick [phone] 10/24/2013, 11:37 AM

## 2013-10-24 NOTE — Progress Notes (Signed)
   Subjective: 3 Days Post-Op Procedure(s) (LRB): TOTAL RIGHT  KNEE ARTHROPLASTY (Right)  Pt doing well Mild pain in the knee Has questions about mild swelling in the knee and leg Overall improving steadily Patient reports pain as mild.  Objective:   VITALS:   Filed Vitals:   10/24/13 0459  BP: 122/62  Pulse: 67  Temp: 98.6 F (37 C)  Resp: 16    Right knee incision healing well nv intact distally No rashes or edema  LABS  Recent Labs  10/22/13 0420 10/23/13 0450 10/24/13 0453  HGB 12.7* 10.6* 10.6*  HCT 37.5* 31.8* 31.2*  WBC 8.7 8.2 8.1  PLT 162 155 172     Recent Labs  10/22/13 0420  NA 135*  K 4.6  BUN 20  CREATININE 1.36*  GLUCOSE 159*     Assessment/Plan: 3 Days Post-Op Procedure(s) (LRB): TOTAL RIGHT  KNEE ARTHROPLASTY (Right)  Plan for d/c to rehab tomorrow Continue PT/OT Pain control as needed    Merla Riches, MPAS, PA-C  10/24/2013, 8:26 AM

## 2013-10-24 NOTE — Progress Notes (Signed)
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   Anticipated DC Date:  10/25/2013   Anticipated DC Plan:  SKILLED NURSING FACILITY  In-house referral  Clinical Social Worker      DC Planning Services  CM consult      [facility] Choice  HOME HEALTH   Choice offered to / List presented to:  C-1 Patient        [place] arranged  HH-2 PT      [place]   Status of service:  Completed, signed off Medicare Important Message given?  YES (If response is "NO", the following Medicare IM given date fields will be blank) Date Medicare IM given:  10/24/2013 Medicare IM given by:  [facility] Date Additional Medicare IM given:   Additional Medicare IM given by:    Discharge Disposition:  [place]  Per UR Regulation:  Reviewed for med. necessity/level of care/duration of stay  If discussed at [place] of Stay Meetings, dates discussed:    Comments:  10/22/13 KATHY MAHABIR RN,BSN [email or web address] [email or web address] FOLLOWING FOR D/C TO SNF.

## 2013-10-24 NOTE — Progress Notes (Signed)
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  Pt declined am session due to fatigue after OT session.  PM session instructed on KI use for amb and proper application.  Assisted with amb in hallway then returned to bed for CPM.  Pt required + 2 assist for safety and increased time.    Follow Up Recommendations  SNF [facility])     Equipment Recommendations  None recommended by PT    Recommendations for Other Services       Precautions / Restrictions Precautions Precautions: Knee;Fall Precaution Comments: Instructed pt on KI use for amb Required Braces or Orthoses: Knee Immobilizer - Right Knee Immobilizer - Right: Discontinue once straight leg raise with < 10 degree lag Restrictions Weight Bearing Restrictions: No Other Position/Activity Restrictions: WBAT    Mobility  Bed Mobility Overal bed mobility: Needs Assistance Bed Mobility: Sit to Supine     Supine to sit: Min assist Sit to supine: Mod assist   General bed mobility comments: mod assist back to bed  Transfers Overall transfer level: Needs assistance Equipment used: Rolling walker (2 wheeled) Transfers: Sit to/from Stand Sit to Stand: Min assist;Mod assist         General transfer comment: severe posterior lean LOB requiring 50% VC's on proper hand placement and forward lean.    Ambulation/Gait Ambulation/Gait assistance: Min assist;Mod assist Ambulation Distance (Feet): 47 Feet Assistive device: Rolling walker (2 wheeled) Gait Pattern/deviations: Step-to pattern;Trunk flexed;Shuffle;Ataxic Gait velocity: decreased   General Gait Details: ntermittent posterior LOB and very unsteady, choppy gait.  + 2 for safety. Poor self corrective responce.   Stairs            Wheelchair  Mobility    Modified Rankin (Stroke Patients Only)       Balance                                    Cognition Arousal/Alertness: Suspect due to medications;Lethargic (groggy at start of session and end) Behavior During Therapy: WFL for tasks assessed/performed Overall Cognitive Status: Within Functional Limits for tasks assessed                      Exercises      General Comments        Pertinent Vitals/Pain Pain Assessment: 0-10 Pain Score: 10-Worst pain ever (briefly at a 10 but down to 8 with rest) Pain Location: R knee Pain Descriptors / Indicators: Aching Pain Intervention(s): Repositioned;Ice applied    Home Living Family/patient expects to be discharged to:: Skilled nursing facility Living Arrangements: Alone                  Prior Function Level of Independence: Independent      Comments: long standing B knee pain limiting activity level    PT Goals (current goals can now be found in the care plan section) Acute Rehab PT Goals Patient Stated Goal: Rehab and home to resume previous lifestyle with decreased pain Progress towards PT goals: Progressing toward goals    Frequency  7X/week    PT Plan      Co-evaluation  End of Session Equipment Utilized During Treatment: Gait belt;Right knee immobilizer Activity Tolerance: Patient limited by fatigue;Patient limited by pain Patient left: in bed;with call [name]/phone within reach;with family/visitor present     Time: 8676-7209 PT Time Calculation (min): 25 min  Charges:  $Gait Training: 8-22 mins $Therapeutic Activity: 8-22 mins                    G Codes:      [name]  PTA [facility]  Acute  Rehab Pager      [phone]

## 2013-10-25 ENCOUNTER — Encounter: Payer: Self-pay | Admitting: Internal Medicine

## 2013-10-25 LAB — GLUCOSE, CAPILLARY: Glucose-Capillary: 147 mg/dL — ABNORMAL HIGH (ref 70–99)

## 2013-10-25 NOTE — Discharge Summary (Signed)
Physician Discharge Summary   Patient ID: [name], Joseph 73 y.o.  Admit date: 10/21/2013 Discharge date:   Primary Diagnosis: right knee DJD  Admission Diagnoses:  Past Medical History  Diagnosis Date  . Type II diabetes mellitus, well controlled     with diet and exercise  . Hypertension, essential, benign   . Dyslipidemia     On fenofibrate and fish oil  . Hypothyroidism   . BPH (benign prostatic hyperplasia)      followed by Dr. Risa Grill  . CKD (chronic kidney disease) stage 3, GFR 30-59 ml/min      Dr. Arty Baumgartner  . Arthritis   . Tinnitus   . History of kidney stones 15 years ago  . Tendonitis of elbow, right   . Hearing loss   . 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 Hx of colonic polyps   . Hernia     from 2006 surgery   . Staph infection 4 years ago  . Pneumonia 12/14-1/15    04/15/13-continued cough, non productive, no fever   Discharge Diagnoses:   Active Problems:   Right knee DJD  Estimated body mass index is 31.[name] kg/(m^2) as calculated from the following:   Height as of this encounter: 5' 10"  (1.778 m).   Weight as of this encounter: 98.884 kg (218 lb).  Procedure:  Procedure(s) (LRB): TOTAL RIGHT  KNEE ARTHROPLASTY (Right)   Consults: None  HPI: see H&P Laboratory Data: Admission on 10/21/2013  Component Date Value Ref Range Status  . Glucose-Capillary 10/21/2013 131* 70 - 99 mg/dL Final  . Comment 1 10/21/2013 Documented in Chart   Final  . Glucose-Capillary 10/21/2013 140* 70 - 99 mg/dL Final  . Comment 1 10/21/2013 Documented in Chart   Final  . Comment 2 10/21/2013 Notify RN   Final  . WBC 10/22/2013 8.7  4.0 - 10.5 K/uL Final  . RBC 10/22/2013 4.23  4.22 - 5.81 MIL/uL Final  . Hemoglobin 10/22/2013 12.7* 13.0 - 17.0 g/dL Final  . HCT 10/22/2013 37.5* 39.0 - 52.0 % Final  . MCV 10/22/2013 88.7  78.0 - 100.0 fL Final  . MCH 10/22/2013 30.0  26.0 - 34.0 pg Final  . MCHC 10/22/2013 33.9  30.0 - 36.0 g/dL  Final  . RDW 10/22/2013 13.3  11.5 - 15.5 % Final  . Platelets 10/22/2013 162  150 - 400 K/uL Final  . Sodium 10/22/2013 135* 137 - 147 mEq/L Final  . Potassium 10/22/2013 4.6  3.7 - 5.3 mEq/L Final  . Chloride 10/22/2013 100  96 - 112 mEq/L Final  . CO2 10/22/2013 24  19 - 32 mEq/L Final  . Glucose, Bld 10/22/2013 159* 70 - 99 mg/dL Final  . BUN 10/22/2013 20  6 - 23 mg/dL Final  . Creatinine, Ser 10/22/2013 1.36* 0.50 - 1.35 mg/dL Final  . Calcium 10/22/2013 8.4  8.4 - 10.5 mg/dL Final  . GFR calc non Af Amer 10/22/2013 50* >90 mL/min Final  . GFR calc Af Amer 10/22/2013 58* >90 mL/min Final   Comment: (NOTE)                          The eGFR has been calculated using the CKD EPI equation.                          This calculation has not been validated in all  clinical situations.                          eGFR's persistently <90 mL/min signify possible Chronic Kidney                          Disease.  . Anion gap 10/22/2013 11  5 - 15 Final  . Glucose-Capillary 10/21/2013 125* 70 - 99 mg/dL Final  . Comment 1 10/21/2013 Notify RN   Final  . Comment 2 10/21/2013 Documented in Chart   Final  . Glucose-Capillary 10/21/2013 134* 70 - 99 mg/dL Final  . Glucose-Capillary 10/22/2013 151* 70 - 99 mg/dL Final  . Glucose-Capillary 10/22/2013 161* 70 - 99 mg/dL Final  . Glucose-Capillary 10/22/2013 165* 70 - 99 mg/dL Final  . WBC 10/23/2013 8.2  4.0 - 10.5 K/uL Final  . RBC 10/23/2013 3.60* 4.22 - 5.81 MIL/uL Final  . Hemoglobin 10/23/2013 10.6* 13.0 - 17.0 g/dL Final  . HCT 10/23/2013 31.8* 39.0 - 52.0 % Final  . MCV 10/23/2013 88.3  78.0 - 100.0 fL Final  . MCH 10/23/2013 29.4  26.0 - 34.0 pg Final  . MCHC 10/23/2013 33.3  30.0 - 36.0 g/dL Final  . RDW 10/23/2013 13.1  11.5 - 15.5 % Final  . Platelets 10/23/2013 155  150 - 400 K/uL Final  . Glucose-Capillary 10/22/2013 156* 70 - 99 mg/dL Final  . Comment 1 10/22/2013 Notify RN   Final  . Glucose-Capillary 10/23/2013 150* 70 - 99 mg/dL  Final  . Glucose-Capillary 10/23/2013 149* 70 - 99 mg/dL Final  . WBC 10/24/2013 8.1  4.0 - 10.5 K/uL Final  . RBC 10/24/2013 3.57* 4.22 - 5.81 MIL/uL Final  . Hemoglobin 10/24/2013 10.6* 13.0 - 17.0 g/dL Final  . HCT 10/24/2013 31.2* 39.0 - 52.0 % Final  . MCV 10/24/2013 87.4  78.0 - 100.0 fL Final  . MCH 10/24/2013 29.7  26.0 - 34.0 pg Final  . MCHC 10/24/2013 34.0  30.0 - 36.0 g/dL Final  . RDW 10/24/2013 12.8  11.5 - 15.5 % Final  . Platelets 10/24/2013 172  150 - 400 K/uL Final  . Glucose-Capillary 10/23/2013 160* 70 - 99 mg/dL Final  . Comment 1 10/23/2013 Documented in Chart   Final  . Comment 2 10/23/2013 Notify RN   Final  . Glucose-Capillary 10/23/2013 138* 70 - 99 mg/dL Final  . Comment 1 10/23/2013 Notify RN   Final  . Glucose-Capillary 10/24/2013 132* 70 - 99 mg/dL Final  . Glucose-Capillary 10/24/2013 144* 70 - 99 mg/dL Final  . Glucose-Capillary 10/24/2013 117* 70 - 99 mg/dL Final  . Glucose-Capillary 10/24/2013 130* 70 - 99 mg/dL Final  . Comment 1 10/24/2013 Notify RN   Final  Hospital Outpatient Visit on 10/15/2013  Component Date Value Ref Range Status  . Prothrombin Time 10/15/2013 13.1  11.6 - 15.2 seconds Final  . INR 10/15/2013 0.99  0.00 - 1.49 Final  . ABO/RH(D) 10/15/2013 O NEG   Final  . Antibody Screen 10/15/2013 NEG   Final  . Sample Expiration 10/15/2013 10/24/2013   Final  . Color, Urine 10/15/2013 YELLOW  YELLOW Final  . APPearance 10/15/2013 CLEAR  CLEAR Final  . Specific Gravity, Urine 10/15/2013 1.012  1.005 - 1.030 Final  . pH 10/15/2013 5.5  5.0 - 8.0 Final  . Glucose, UA 10/15/2013 NEGATIVE  NEGATIVE mg/dL Final  . Hgb urine dipstick 10/15/2013 NEGATIVE  NEGATIVE Final  .  Bilirubin Urine 10/15/2013 NEGATIVE  NEGATIVE Final  . Ketones, ur 10/15/2013 NEGATIVE  NEGATIVE mg/dL Final  . Protein, ur 10/15/2013 NEGATIVE  NEGATIVE mg/dL Final  . Urobilinogen, UA 10/15/2013 0.2  0.0 - 1.0 mg/dL Final  . Nitrite 10/15/2013 NEGATIVE  NEGATIVE Final  .  Leukocytes, UA 10/15/2013 NEGATIVE  NEGATIVE Final   MICROSCOPIC NOT DONE ON URINES WITH NEGATIVE PROTEIN, BLOOD, LEUKOCYTES, NITRITE, OR GLUCOSE <1000 mg/dL.  Marland Kitchen MRSA, PCR 10/15/2013 POSITIVE* NEGATIVE Final   Comment: RESULT CALLED TO, READ BACK BY AND VERIFIED WITH:                          FAISON,M. @ 1301 ON 8.14.15 BY MCCOY,N.  . Staphylococcus aureus 10/15/2013 POSITIVE* NEGATIVE Final   Comment:                                 The Xpert SA Assay (FDA                          approved for NASAL specimens                          in patients over 36 years of age),                          is one component of                          a comprehensive surveillance                          program.  Test performance has                          been validated by [employer] for patients greater                          than or equal to 30 year old.                          It is not intended                          to diagnose infection nor to                          guide or monitor treatment.  . WBC 10/15/2013 5.0  4.0 - 10.5 K/uL Final  . RBC 10/15/2013 5.02  4.22 - 5.81 MIL/uL Final  . Hemoglobin 10/15/2013 15.1  13.0 - 17.0 g/dL Final  . HCT 10/15/2013 43.6  39.0 - 52.0 % Final  . MCV 10/15/2013 86.9  78.0 - 100.0 fL Final  . MCH 10/15/2013 30.1  26.0 - 34.0 pg Final  . MCHC 10/15/2013 34.6  30.0 - 36.0 g/dL Final  . RDW 10/15/2013 13.1  11.5 - 15.5 % Final  . Platelets 10/15/2013 178  150 - 400 K/uL Final  .  Sodium 10/15/2013 136* 137 - 147 mEq/L Final  . Potassium 10/15/2013 4.6  3.7 - 5.3 mEq/L Final  . Chloride 10/15/2013 99  96 - 112 mEq/L Final  . CO2 10/15/2013 22  19 - 32 mEq/L Final  . Glucose, Bld 10/15/2013 136* 70 - 99 mg/dL Final  . BUN 10/15/2013 19  6 - 23 mg/dL Final  . Creatinine, Ser 10/15/2013 1.29  0.50 - 1.35 mg/dL Final  . Calcium 10/15/2013 10.2  8.4 - 10.5 mg/dL Final  . GFR calc non Af Amer 10/15/2013 53* >90 mL/min Final    . GFR calc Af Amer 10/15/2013 61* >90 mL/min Final   Comment: (NOTE)                          The eGFR has been calculated using the CKD EPI equation.                          This calculation has not been validated in all clinical situations.                          eGFR's persistently <90 mL/min signify possible Chronic Kidney                          Disease.  . Anion gap 10/15/2013 15  5 - 15 Final  . aPTT 10/15/2013 33  24 - 37 seconds Final     X-Rays:Dg Knee 1-2 Views Right  10/21/2013   CLINICAL DATA:  Postop RIGHT total knee replacement  EXAM: RIGHT KNEE - 1-2 VIEW  COMPARISON:  Portable exam 9379 hr compared to 10/15/2013  FINDINGS: Components of RIGHT knee prosthesis identified.  Osseous demineralization.  No acute fracture, dislocation or bone destruction.  Small amount of bone debris laterally at RIGHT knee.  Skin clips, surgical drain, soft tissue swelling and scattered soft tissue gas related to prior surgery.  No periprosthetic lucency.  IMPRESSION: RIGHT knee prosthesis without acute complication.   Electronically Signed   By: Lavonia Dana M.D.   On: 10/21/2013 13:16   Dg Knee 1-2 Views Right  10/15/2013   CLINICAL DATA:  Preoperative evaluation for RIGHT knee surgery, DJD  EXAM: RIGHT KNEE - 1-2 VIEW  COMPARISON:  None  FINDINGS: Osseous demineralization.  Degenerative changes with joint space narrowing and marginal spur formation, greatest at medial compartment.  No acute fracture, dislocation or bone destruction.  No significant knee joint effusion.  Regional soft tissues unremarkable.  IMPRESSION: Osteoarthritic changes RIGHT knee.  Osseous demineralization.   Electronically Signed   By: Lavonia Dana M.D.   On: 10/15/2013 09:58    EKG: Orders placed in visit on 12/01/12  . EKG 12-LEAD     Hospital Course: [name] is a 73 y.o. who was admitted to [facility]. They were brought to the operating room on 10/21/2013 and underwent Procedure(s): TOTAL RIGHT  KNEE  ARTHROPLASTY.  Patient tolerated the procedure well and was later transferred to the recovery room and then to the orthopaedic floor for postoperative care.  They were given PO and IV analgesics for pain control following their surgery.  They were given 24 hours of postoperative antibiotics of  Anti-infectives   Start     Dose/Rate Route Frequency Ordered Stop   10/21/13 2200  vancomycin (VANCOCIN) 1,500 mg in sodium chloride 0.9 %  500 mL IVPB     1,500 mg 250 mL/hr over 120 Minutes Intravenous Every 12 hours 10/21/13 1412 10/22/13 0055   10/21/13 1600  ceFAZolin (ANCEF) IVPB 2 g/50 mL premix     2 g 100 mL/hr over 30 Minutes Intravenous 3 times per day 10/21/13 1412 10/21/13 2146   10/21/13 1000  polymyxin B 500,000 Units, bacitracin 50,000 Units in sodium chloride irrigation 0.9 % 500 mL irrigation      Irrigation  Once 10/21/13 1402     10/21/13 0815  ceFAZolin (ANCEF) IVPB 2 g/50 mL premix     2 g 100 mL/hr over 30 Minutes Intravenous On call to O.R. 10/21/13 0803 10/21/13 1045   10/21/13 0803  vancomycin (VANCOCIN) 1,500 mg in sodium chloride 0.9 % 500 mL IVPB     1,500 mg 250 mL/hr over 120 Minutes Intravenous On call to O.R. 10/21/13 8325 10/21/13 0945     and started on DVT prophylaxis in the form of Xarelto, TED hose and SCDs.   PT and OT were ordered for total joint protocol.  Discharge planning consulted to help with postop disposition and equipment needs.  Patient had a fair night on the evening of surgery.  They started to get up OOB with therapy on day one. Hemovac drain was pulled without difficulty.  Continued to work with therapy into day two.  By day three, the patient had progressed with therapy and meeting their goals.  Incision was healing well.  Patient was seen in rounds daily and was ready to go to SNF on POD 4.   Diet: low sodium heart healthy Activity:WBAT Follow-up:in 10 days Disposition - Skilled nursing facility Discharged Condition: good   Discharge  Instructions   Call MD / Call 911    Complete by:  As directed   If you experience chest pain or shortness of breath, CALL 911 and be transported to the hospital emergency room.  If you develope a fever above 101 F, pus (white drainage) or increased drainage or redness at the wound, or calf pain, call your surgeon's office.     Constipation Prevention    Complete by:  As directed   Drink plenty of fluids.  Prune juice may be helpful.  You may use a stool softener, such as Colace (over the counter) 100 mg twice a day.  Use MiraLax (over the counter) for constipation as needed.     Diet - low sodium heart healthy    Complete by:  As directed      Increase activity slowly as tolerated    Complete by:  As directed             Medication List    STOP taking these medications       aspirin EC 81 MG tablet     Omega 3 1000 MG Caps     oxyCODONE-acetaminophen 5-325 MG per tablet  Commonly known as:  PERCOCET/ROXICET  Replaced by:  oxyCODONE-acetaminophen 7.5-325 MG per tablet      TAKE these medications       desmopressin 0.2 MG tablet  Commonly known as:  DDAVP  Take 0.2 mg by mouth at bedtime.     docusate sodium 100 MG capsule  Commonly known as:  COLACE  Take 1 capsule (100 mg total) by mouth 2 (two) times daily as needed for mild constipation.     doxazosin 4 MG tablet  Commonly known as:  CARDURA  Take 4 mg by mouth at bedtime.  HYDROcodone-homatropine 5-1.5 MG/5ML syrup  Commonly known as:  HYCODAN  Take 5 mLs by mouth every 4 (four) hours as needed for cough.     levothyroxine 100 MCG tablet  Commonly known as:  SYNTHROID, LEVOTHROID  Take 100 mcg by mouth daily before breakfast.     losartan 25 MG tablet  Commonly known as:  COZAAR  Take 25 mg by mouth at bedtime.     oxybutynin 5 MG 24 hr tablet  Commonly known as:  DITROPAN-XL  Take 10 mg by mouth at bedtime.     oxyCODONE-acetaminophen 7.5-325 MG per tablet  Commonly known as:  PERCOCET  Take 1-2  tablets by mouth every 4 (four) hours as needed for pain.     rivaroxaban 10 MG Tabs tablet  Commonly known as:  XARELTO  Take 1 tablet (10 mg total) by mouth daily.     temazepam 22.5 MG capsule  Commonly known as:  RESTORIL  Take 22.5 mg by mouth at bedtime as needed for sleep.           Follow-up Information   Follow up with Johnn Hai, MD.   Specialty:  Orthopedic Surgery   Contact information:   [address] [place] [address] [phone]       Signed: Lacie Draft, PA-C Orthopaedic Surgery 10/25/2013, 7:58 AM

## 2013-10-25 NOTE — Progress Notes (Signed)
Subjective: 4 Days Post-Op Procedure(s) (LRB): TOTAL RIGHT  KNEE ARTHROPLASTY (Right) Patient reports pain as moderate.   Reports pain is improving. He is concerned about swelling. He is happy with CPM use and feels it's helping. Feeling ready for D/C today. Has no other c/o this AM.  Objective: Vital signs in last 24 hours: Temp:  [98.1 F (36.7 C)-99.5 F (37.5 C)] 98.1 F (36.7 C) (08/24 0419) Pulse Rate:  [71-78] 71 (08/24 0419) Resp:  [16-18] 16 (08/24 0419) BP: (125-130)/(53-68) 129/53 mmHg (08/24 0419) SpO2:  [94 %-96 %] 94 % (08/24 0419)  Intake/Output from previous day: 08/23 0701 - 08/24 0700 In: 720 [P.O.:720] Out: 1050 [Urine:1050] Intake/Output this shift:     Recent Labs  10/23/13 0450 10/24/13 0453  HGB 10.6* 10.6*    Recent Labs  10/23/13 0450 10/24/13 0453  WBC 8.2 8.1  RBC 3.60* 3.57*  HCT 31.8* 31.2*  PLT 155 172   No results found for this basename: NA, K, CL, CO2, BUN, CREATININE, GLUCOSE, CALCIUM,  in the last 72 hours No results found for this basename: LABPT, INR,  in the last 72 hours  Neurologically intact ABD soft Neurovascular intact Sensation intact distally Intact pulses distally Dorsiflexion/Plantar flexion intact Incision: dressing C/D/I and no drainage No cellulitis present Compartment soft no calf pain or sign of DVT Moderate swelling RLE  Assessment/Plan: 4 Days Post-Op Procedure(s) (LRB): TOTAL RIGHT  KNEE ARTHROPLASTY (Right) Advance diet Up with therapy D/C IV fluids Continue ice and elevation, toes above the nose, at least 5-6x/day 20-30 min each to reduce swelling TED stockings Plan D/C to SNF today  Discussed D/C instructions Follow up 10-14 days post op in office for staple removal and xrays Will discuss with Dr. Mliss Fritz, [place] Rolls. 10/25/2013, 7:55 AM

## 2013-10-25 NOTE — Progress Notes (Signed)
Clinical Social Work Department CLINICAL SOCIAL WORK PLACEMENT NOTE 10/25/2013  Patient:  [name], [name]  Account Number:  [id number] Admit date:  10/21/2013  Clinical Social Worker:  Werner Lean, LCSW  Date/time:  10/25/2013 01:17 PM  Clinical Social Work is seeking post-discharge placement for this patient at the following level of care:   SKILLED NURSING   (*CSW will update this form in Epic as items are completed)     Patient/family provided with [place] Department of Clinical Social Work's list of facilities offering this level of care within the geographic area requested by the patient (or if unable, by the patient's family).  10/21/2013  Patient/family informed of their freedom to choose among providers that offer the needed level of care, that participate in Medicare, Medicaid or managed care program needed by the patient, have an available bed and are willing to accept the patient.    Patient/family informed of [facility]' ownership interest in [facility], as well as of the fact that they are under no obligation to receive care at this facility.  PASARR submitted to EDS on 10/21/2013 PASARR number received on 10/21/2013  FL2 transmitted to all facilities in geographic area requested by pt/family on  10/21/2013 FL2 transmitted to all facilities within larger geographic area on   Patient informed that his/her managed care company has contracts with or will negotiate with  certain facilities, including the following:     Patient/family informed of bed offers received:  10/21/2013 Patient chooses bed at [place] Physician recommends and patient chooses bed at    Patient to be transferred to [place] on  10/25/2013 Patient to be transferred to facility by [facility] Patient and family notified of transfer on 10/25/2013 Name of family member notified:  Pt declined csw assistance.  The following physician request were entered in Epic:   Additional  Comments: Pt / family are in agreement with d/c to SNF via [facility] transport today. NSG reviewed d/c summary, scripts, avs. Scripts are included in d/c packet.  Werner Lean LCSW [phone]

## 2013-10-28 ENCOUNTER — Ambulatory Visit: Payer: Self-pay | Admitting: Gerontology

## 2013-11-02 ENCOUNTER — Encounter: Payer: Self-pay | Admitting: Internal Medicine

## 2013-12-23 ENCOUNTER — Other Ambulatory Visit: Payer: Self-pay | Admitting: Cardiology

## 2013-12-23 NOTE — Telephone Encounter (Signed)
Rx was sent to pharmacy electronically. 

## 2014-03-21 ENCOUNTER — Other Ambulatory Visit: Payer: Self-pay | Admitting: Cardiology

## 2014-03-21 NOTE — Telephone Encounter (Signed)
Rx(s) sent to pharmacy electronically. Last OV 11/2012 Message sent to scheduler to contact patient for appointment

## 2014-03-22 ENCOUNTER — Telehealth: Payer: Self-pay | Admitting: Cardiology

## 2014-03-23 NOTE — Telephone Encounter (Signed)
Close encounter 

## 2014-05-26 ENCOUNTER — Encounter: Payer: Self-pay | Admitting: *Deleted

## 2014-06-06 ENCOUNTER — Encounter: Payer: Self-pay | Admitting: Cardiology

## 2014-06-06 ENCOUNTER — Ambulatory Visit (INDEPENDENT_AMBULATORY_CARE_PROVIDER_SITE_OTHER): Payer: Medicare Other | Admitting: Cardiology

## 2014-06-06 VITALS — BP 128/76 | HR 67 | Ht 70.0 in | Wt 215.8 lb

## 2014-06-06 DIAGNOSIS — I10 Essential (primary) hypertension: Secondary | ICD-10-CM | POA: Diagnosis not present

## 2014-06-06 DIAGNOSIS — E119 Type 2 diabetes mellitus without complications: Secondary | ICD-10-CM

## 2014-06-06 DIAGNOSIS — E785 Hyperlipidemia, unspecified: Secondary | ICD-10-CM | POA: Diagnosis not present

## 2014-06-06 DIAGNOSIS — E669 Obesity, unspecified: Secondary | ICD-10-CM | POA: Diagnosis not present

## 2014-06-06 NOTE — Progress Notes (Signed)
PCP: Abigail Miyamoto, MD  Clinic Note: Chief Complaint  Patient presents with  . Annual Exam     NO CHEST PAIN , NO SOB , NO SWELLING, TOTAL KNEE REPLACEMENT X 2 LAST YEAR, STAGE 3 KIDNEY DISEASE.,DM    HPI: [name] is a 74 y.o. male with a PMH below who presents today for 18 month follow up of Cardiac Risk Factors - hypertension, type 2 diabetes mellitus and borderline hyperlipidemia. He has no documented history of coronary disease and previous evaluations have proven to be negative for significant CAD -last echocardiogram or Myoview was in 2010 indicated below.. Works out daily with ground exercises & walks 3-5 miles walking /day. Also rides bike - gets HR up to 100-110.  No CP/SOB with exertion.   Cardiovascular ROS: no chest pain or dyspnea on exertion negative for - chest pain, edema, irregular heartbeat, loss of consciousness, murmur, orthopnea, palpitations, paroxysmal nocturnal dyspnea, rapid heart rate, shortness of breath or syncope/near syncope, TIA/Amaurosis Fugax  CKD-3 stable with Dr. Arty Baumgartner Prostate stable   Past Medical History  Diagnosis Date  . Type II diabetes mellitus, well controlled     with diet and exercise  . Hypertension, essential, benign   . Dyslipidemia     On fenofibrate and fish oil  . Hypothyroidism   . BPH (benign prostatic hyperplasia)      followed by Dr. Risa Grill  . CKD (chronic kidney disease) stage 3, GFR 30-59 ml/min      Dr. Arty Baumgartner  . Arthritis   . Tinnitus   . History of kidney stones 15 years ago  . Tendonitis of elbow, right   . Hearing loss   . 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 Hx of colonic polyps   . Hernia     from 2006 surgery   . Staph infection 4 years ago  . Pneumonia 12/14-1/15    04/15/13-continued cough, non productive, no fever    Prior Cardiac Evaluation and Past Surgical History: Past Surgical History  Procedure Laterality Date  . Eye surgery Left 1974    eye removed for eye cancer  .  Colonoscopy w/ polypectomy  2012  . Biceps tendon repair Right 2004  . Shoulder arthroscopy Left   . Small intestine surgery  2006    "took everything out and fixed it"  . Tonsillectomy  1970's  . Vasectomy  1975  . Back surgery  1985    lower back, "2 disc removed"  . Total knee arthroplasty Left 04/22/2013    Procedure: LEFT TOTAL KNEE ARTHROPLASTY;  Surgeon: Johnn Hai, MD;  Location: [facility];  Service: Orthopedics;  Laterality: Left;  . Total knee arthroplasty Right 10/21/2013    Procedure: TOTAL RIGHT  KNEE ARTHROPLASTY;  Surgeon: Johnn Hai, MD;  Location: [facility];  Service: Orthopedics;  Laterality: Right;  . US echocardiography  09/13/2008    mild centrially directed MR, mild TR, boderline aortic root dilatation.  Marland Kitchen Nm myocar perf wall motion  09/13/2008    normal   ROS: A comprehensive was performed. Review of Systems  Constitutional: Negative.   HENT: Positive for congestion (related to pollen).   Respiratory: Positive for cough (allergies). Negative for sputum production, shortness of breath and wheezing.   Cardiovascular: Negative.  Negative for claudication and leg swelling.       Per HPI  Gastrointestinal: Negative for blood in stool and melena.  Genitourinary: Negative for hematuria.  Musculoskeletal: Positive  for joint pain (much better after bilateral Knee surgery - stays active & does better; manages the pain). Negative for myalgias.  Neurological: Negative for dizziness and loss of consciousness.  Psychiatric/Behavioral: Negative.   All other systems reviewed and are negative.   Current Outpatient Prescriptions on File Prior to Visit  Medication Sig Dispense Refill  . desmopressin (DDAVP) 0.2 MG tablet Take 0.2 mg by mouth at bedtime.     Marland Kitchen doxazosin (CARDURA) 4 MG tablet Take 4 mg by mouth at bedtime.    Marland Kitchen levothyroxine (SYNTHROID, LEVOTHROID) 100 MCG tablet Take 100 mcg by mouth daily before breakfast.    . losartan (COZAAR) 25 MG tablet Take 1 tablet  (25 mg total) by mouth daily. <PLEASE MAKE APPOINTMENT FOR REFILLS> 30 tablet 0  . oxybutynin (DITROPAN-XL) 5 MG 24 hr tablet Take 10 mg by mouth at bedtime.      No current facility-administered medications on file prior to visit.   No Known Allergies   History  Substance Use Topics  . Smoking status: Never Smoker   . Smokeless tobacco: Never Used  . Alcohol Use: No   Family History  Problem Relation Age of Onset  . Cancer Mother     lung  . Cancer Father     colon    Wt Readings from Last 3 Encounters:  06/06/14 215 lb 12.8 oz (97.886 kg)  10/21/13 218 lb (98.884 kg)  04/22/13 218 lb 14.7 oz (99.3 kg)    PHYSICAL EXAM BP 128/76 mmHg  Pulse 67  Ht 5\' 10"  (1.778 m)  Wt 215 lb 12.8 oz (97.886 kg)  BMI 30.96 kg/m2 General appearance: alert, cooperative, appears stated age, no distress, mildly obese and Otherwise healthy-appearing, well-nourished and well-groomed. Answers questions appropriately. Alert and oriented x3. Neck: no adenopathy, no carotid bruit, no JVD, supple, symmetrical, trachea midline and thyroid not enlarged, symmetric, no tenderness/mass/nodules Lungs: clear to auscultation bilaterally, normal percussion bilaterally and Nonlabored, good air movement Heart: regular rate and rhythm, S1, S2 normal, no murmur, click, rub or gallop and normal apical impulse Abdomen: soft, non-tender; bowel sounds normal; no masses, no organomegaly and Mild truncal obesity Extremities: extremities normal, atraumatic, no cyanosis or edema, no edema, redness or tenderness in the calves or thighs, no ulcers, gangrene or trophic changes, varicose veins noted and There is mild swelling but no other erythema on the right knee Pulses: 2+ and symmetric   Adult ECG Report  Rate: 67 ;  Rhythm: normal sinus rhythm  Narrative Interpretation: Normal EKG  - normal intervals, axis, durations.  Recent Labs:  Check by Nephrology & PCP (to be sent over)    ASSESSMENT / PLAN: Problem List  Items Addressed This Visit    Dyslipidemia - Primary (Chronic)    He does not appear to be on fenofibrate anymore. Labs were recently checked by PCP and nephrologist.  I will review results when forwarded      Relevant Orders   EKG 12-Lead (Completed)   Essential hypertension (Chronic)    Well controlled on ARB. Also likely getting some effect from Cardura. No change      Relevant Orders   EKG 12-Lead (Completed)   Obesity (BMI 30-39.9) (Chronic)    Despite his routine exercise program, he is only lost 3 pounds since his last visit a year and a half ago. He is still continuing to stay active and exercise. We discussed the need for dietary modification with reducing portion size      Type II  diabetes mellitus, well controlled    Monitored by PCP.         Orders Placed This Encounter  Procedures  . EKG 12-Lead   Meds ordered this encounter  Medications  . traZODone (DESYREL) 50 MG tablet    Sig: Take 50 mg by mouth at bedtime as needed for sleep.     Followup: 1 yr    Leonie Man, M.D., M.S. Interventional Cardiologist   Pager # [phone]

## 2014-06-06 NOTE — Patient Instructions (Signed)
NO CHANGE IN CURRENT  MEDICATIONS  CALL OR SEND COPY OF LAB WORK - LIPIDS  Your physician wants you to follow-up in 58 MONTH Dr Ellyn Hack.   You will receive a reminder letter in the mail two months in advance. If you don't receive a letter, please call our office to schedule the follow-up appointment.

## 2014-06-08 ENCOUNTER — Encounter: Payer: Self-pay | Admitting: Cardiology

## 2014-06-08 NOTE — Assessment & Plan Note (Signed)
He does not appear to be on fenofibrate anymore. Labs were recently checked by PCP and nephrologist.  I will review results when forwarded

## 2014-06-08 NOTE — Assessment & Plan Note (Signed)
Despite his routine exercise program, he is only lost 3 pounds since his last visit a year and a half ago. He is still continuing to stay active and exercise. We discussed the need for dietary modification with reducing portion size

## 2014-06-08 NOTE — Assessment & Plan Note (Signed)
Well controlled on ARB. Also likely getting some effect from Cardura. No change

## 2014-06-08 NOTE — Assessment & Plan Note (Signed)
Monitored by PCP

## 2015-04-11 ENCOUNTER — Other Ambulatory Visit: Payer: Self-pay | Admitting: Gastroenterology

## 2015-08-11 ENCOUNTER — Emergency Department (HOSPITAL_COMMUNITY)
Admission: EM | Admit: 2015-08-11 | Discharge: 2015-08-11 | Disposition: A | Discharge status: Home / Self Care | Payer: Medicare Other | Attending: Emergency Medicine | Admitting: Emergency Medicine

## 2015-08-11 ENCOUNTER — Emergency Department (HOSPITAL_COMMUNITY): Payer: Medicare Other

## 2015-08-11 ENCOUNTER — Encounter (HOSPITAL_COMMUNITY): Payer: Self-pay | Admitting: Emergency Medicine

## 2015-08-11 DIAGNOSIS — N183 Chronic kidney disease, stage 3 (moderate): Secondary | ICD-10-CM | POA: Insufficient documentation

## 2015-08-11 DIAGNOSIS — Z79899 Other long term (current) drug therapy: Secondary | ICD-10-CM | POA: Diagnosis not present

## 2015-08-11 DIAGNOSIS — I129 Hypertensive chronic kidney disease with stage 1 through stage 4 chronic kidney disease, or unspecified chronic kidney disease: Secondary | ICD-10-CM | POA: Insufficient documentation

## 2015-08-11 DIAGNOSIS — E039 Hypothyroidism, unspecified: Secondary | ICD-10-CM | POA: Diagnosis not present

## 2015-08-11 DIAGNOSIS — Z85840 Personal history of malignant neoplasm of eye: Secondary | ICD-10-CM | POA: Insufficient documentation

## 2015-08-11 DIAGNOSIS — Z7982 Long term (current) use of aspirin: Secondary | ICD-10-CM | POA: Insufficient documentation

## 2015-08-11 DIAGNOSIS — R42 Dizziness and giddiness: Secondary | ICD-10-CM

## 2015-08-11 DIAGNOSIS — E1122 Type 2 diabetes mellitus with diabetic chronic kidney disease: Secondary | ICD-10-CM | POA: Insufficient documentation

## 2015-08-11 DIAGNOSIS — E860 Dehydration: Secondary | ICD-10-CM | POA: Insufficient documentation

## 2015-08-11 DIAGNOSIS — E785 Hyperlipidemia, unspecified: Secondary | ICD-10-CM | POA: Diagnosis not present

## 2015-08-11 DIAGNOSIS — M1990 Unspecified osteoarthritis, unspecified site: Secondary | ICD-10-CM | POA: Diagnosis not present

## 2015-08-11 LAB — URINE MICROSCOPIC-ADD ON
BACTERIA UA: NONE SEEN
RBC / HPF: NONE SEEN RBC/hpf (ref 0–5)
SQUAMOUS EPITHELIAL / LPF: NONE SEEN
WBC UA: NONE SEEN WBC/hpf (ref 0–5)

## 2015-08-11 LAB — COMPREHENSIVE METABOLIC PANEL
ALBUMIN: 4 g/dL (ref 3.5–5.0)
ALT: 18 U/L (ref 17–63)
AST: 22 U/L (ref 15–41)
Alkaline Phosphatase: 51 U/L (ref 38–126)
Anion gap: 7 (ref 5–15)
BILIRUBIN TOTAL: 0.4 mg/dL (ref 0.3–1.2)
BUN: 35 mg/dL — AB (ref 6–20)
CO2: 21 mmol/L — ABNORMAL LOW (ref 22–32)
Calcium: 9 mg/dL (ref 8.9–10.3)
Chloride: 108 mmol/L (ref 101–111)
Creatinine, Ser: 1.36 mg/dL — ABNORMAL HIGH (ref 0.61–1.24)
GFR calc Af Amer: 57 mL/min — ABNORMAL LOW (ref >60)
GFR calc non Af Amer: 49 mL/min — ABNORMAL LOW (ref >60)
Glucose, Bld: 144 mg/dL — ABNORMAL HIGH (ref 65–99)
Potassium: 4.2 mmol/L (ref 3.5–5.1)
SODIUM: 136 mmol/L (ref 135–145)
Total Protein: 7.2 g/dL (ref 6.5–8.1)

## 2015-08-11 LAB — CBC WITH DIFFERENTIAL/PLATELET
BASOS ABS: 0 10*3/uL (ref 0.0–0.1)
BASOS PCT: 0 %
Eosinophils Absolute: 0.2 10*3/uL (ref 0.0–0.7)
Eosinophils Relative: 4 %
HCT: 40.8 % (ref 39.0–52.0)
Hemoglobin: 13.9 g/dL (ref 13.0–17.0)
Lymphocytes Relative: 36 %
Lymphs Abs: 1.8 10*3/uL (ref 0.7–4.0)
MCH: 29.4 pg (ref 26.0–34.0)
MCHC: 34.1 g/dL (ref 30.0–36.0)
MCV: 86.3 fL (ref 78.0–100.0)
Monocytes Absolute: 0.3 10*3/uL (ref 0.1–1.0)
Monocytes Relative: 6 %
Neutro Abs: 2.6 10*3/uL (ref 1.7–7.7)
Neutrophils Relative %: 54 %
PLATELETS: 161 10*3/uL (ref 150–400)
RBC: 4.73 MIL/uL (ref 4.22–5.81)
RDW: 13.7 % (ref 11.5–15.5)
WBC: 4.8 10*3/uL (ref 4.0–10.5)

## 2015-08-11 LAB — URINALYSIS, ROUTINE W REFLEX MICROSCOPIC
Bilirubin Urine: NEGATIVE
GLUCOSE, UA: NEGATIVE mg/dL
Hgb urine dipstick: NEGATIVE
Ketones, ur: NEGATIVE mg/dL
Leukocytes, UA: NEGATIVE
Nitrite: NEGATIVE
PROTEIN: 30 mg/dL — AB
Specific Gravity, Urine: 1.025 (ref 1.005–1.030)
pH: 5.5 (ref 5.0–8.0)

## 2015-08-11 LAB — TROPONIN I: Troponin I: <0.03 ng/mL (ref <0.031)

## 2015-08-11 MED ORDER — SODIUM CHLORIDE 0.9 % IV BOLUS (SEPSIS)
1000.0000 mL | Freq: Once | INTRAVENOUS | Status: AC
  Administered 2015-08-11: 1000 mL via INTRAVENOUS

## 2015-08-11 NOTE — ED Notes (Signed)
Pt made aware to return if symptoms worsen or if any life threatening symptoms occur.   

## 2015-08-11 NOTE — ED Notes (Signed)
Per EMS: Pt had onset of dizziness appx 1 hour ago, no stroke like symptoms noted. Pt alert and oriented.   155/85, 58hr, 144cbg

## 2015-08-11 NOTE — ED Provider Notes (Signed)
CSN: [id number]     Arrival date & time 08/11/15  1043 History  By signing my name below, I, [name], attest that this documentation has been prepared under the direction and in the presence of Joseph Pew, MD. Electronically Signed: Eustaquio [name], ED Scribe. 08/11/2015. 11:30 AM.  Chief Complaint  Patient presents with  . Dizziness   The history is provided by the patient. No language interpreter was used.  HPI Comments: [name] is a 75 y.o. male brought in by ambulance, with history of Diabetes (controlled with diet) who presents to the Emergency Department complaining of sudden onset, constant, feeling off balance onset 2 hours ago. Pt states that he was cleaning out his truck this morning and upon walking back into his house he felt off balanced. Pt also notes associated loss of coordination, nausea and mouth dryness. Pt reports he has never felt these symptoms before. No recent sick contact. Pt denies numbness, room spinning dizziness, headache, chest pain, back pain, abdominal pain. Pt states has appointment with PCP 08/14/15 (3 days from now). He is an unreliable historian who is inconsistent with story.    Past Medical History  Diagnosis Date  . Type II diabetes mellitus, well controlled ([place])     with diet and exercise  . Hypertension, essential, benign   . Dyslipidemia     On fenofibrate and fish oil  . Hypothyroidism   . BPH (benign prostatic hyperplasia)      followed by Dr. Risa Grill  . CKD (chronic kidney disease) stage 3, GFR 30-59 ml/min      Dr. Arty Baumgartner  . Arthritis   . Tinnitus   . History of kidney stones 15 years ago  . Tendonitis of elbow, right   . Hearing loss   . 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 Hx of colonic polyps   . Hernia     from 2006 surgery   . Staph infection 4 years ago  . Pneumonia 12/14-1/15    04/15/13-continued cough, non productive, no fever   Past Surgical History  Procedure Laterality Date  . Eye surgery Left  1974    eye removed for eye cancer  . Colonoscopy w/ polypectomy  2012  . Biceps tendon repair Right 2004  . Shoulder arthroscopy Left   . Small intestine surgery  2006    "took everything out and fixed it"  . Tonsillectomy  1970's  . Vasectomy  1975  . Back surgery  1985    lower back, "2 disc removed"  . Total knee arthroplasty Left 04/22/2013    Procedure: LEFT TOTAL KNEE ARTHROPLASTY;  Surgeon: Johnn Hai, MD;  Location: [facility];  Service: Orthopedics;  Laterality: Left;  . Total knee arthroplasty Right 10/21/2013    Procedure: TOTAL RIGHT  KNEE ARTHROPLASTY;  Surgeon: Johnn Hai, MD;  Location: [facility];  Service: Orthopedics;  Laterality: Right;  . US echocardiography  09/13/2008    mild centrially directed MR, mild TR, boderline aortic root dilatation.  Marland Kitchen Nm myocar perf wall motion  09/13/2008    normal   Family History  Problem Relation Age of Onset  . Cancer Mother     lung  . Cancer Father     colon   Social History  Substance Use Topics  . Smoking status: Never Smoker   . Smokeless tobacco: Never Used  . Alcohol Use: No    Review of Systems  Cardiovascular: Negative for chest pain.  Gastrointestinal: Positive for nausea. Negative for abdominal pain.  Neurological: Negative for dizziness, numbness and headaches.       + Feeling off balance  All other systems reviewed and are negative.     Allergies  Review of patient's allergies indicates no known allergies.  Home Medications   Prior to Admission medications   Medication Sig Start Date End Date Taking? Authorizing Provider  aspirin EC 81 MG tablet Take 81 mg by mouth daily.   Yes Historical Provider, MD  Cholecalciferol (VITAMIN D3) 5000 units TABS Take 1 tablet by mouth daily.   Yes Historical Provider, MD  doxazosin (CARDURA) 4 MG tablet Take 4 mg by mouth at bedtime.   Yes Historical Provider, MD  FLUOCINOLONE ACETONIDE SCALP 0.01 % OIL Apply 1 application topically once a week. 07/17/15  Yes  Historical Provider, MD  fluticasone (FLONASE) 50 MCG/ACT nasal spray Place 2 sprays into both nostrils daily. 07/17/15  Yes Historical Provider, MD  levothyroxine (SYNTHROID, LEVOTHROID) 100 MCG tablet Take 100 mcg by mouth daily before breakfast.   Yes Historical Provider, MD  losartan (COZAAR) 25 MG tablet Take 1 tablet (25 mg total) by mouth daily. <PLEASE MAKE APPOINTMENT FOR REFILLS> 03/21/14  Yes Leonie Man, MD  oxybutynin (DITROPAN-XL) 5 MG 24 hr tablet Take 10 mg by mouth at bedtime.    Yes Historical Provider, MD  pravastatin (PRAVACHOL) 10 MG tablet Take 1 tablet by mouth at bedtime.  05/14/15  Yes Historical Provider, MD  traZODone (DESYREL) 50 MG tablet Take 50 mg by mouth at bedtime as needed for sleep.   Yes Historical Provider, MD  Vitamin D, Ergocalciferol, (DRISDOL) 50000 units CAPS capsule Take 1 capsule by mouth every 30 (thirty) days. 07/30/15  Yes Historical Provider, MD   BP 137/74 mmHg  Pulse 44  Temp(Src) 97.8 F (36.6 C) (Oral)  Resp 16  SpO2 97% Physical Exam  Constitutional: He is oriented to person, place, and time. He appears well-developed and well-nourished. No distress.  HENT:  Head: Normocephalic and atraumatic.  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 No tracheal deviation present.  Cardiovascular: Normal rate, regular rhythm and normal heart sounds.   Pulmonary/Chest: Effort normal and breath sounds normal. No respiratory distress. He has no wheezes. He has no rales.  Abdominal: Soft. There is no tenderness.  Abdominal benign No cva tenderness  Musculoskeletal: Normal range of motion.  Neurological: He is alert and oriented to person, place, and time.  Good strenth in bilateral upper and lower extremities No cranial nerve abnormalities  Skin: Skin is warm and dry.  Psychiatric: He has a normal mood and affect. His behavior is normal.  Nursing note and vitals reviewed.   ED Course  Procedures  DIAGNOSTIC  STUDIES: Oxygen Saturation is 92% on RA, adequate by my interpretation.    COORDINATION OF CARE: 10:57 AM-Discussed treatment plan which includes DG chest, CT head, urinalysis, Troponin, comprehensive metabolic panel, and CBC with differentialwith pt at bedside and pt agreed to plan.   Labs Review Labs Reviewed  COMPREHENSIVE METABOLIC PANEL - Abnormal; Notable for the following:    CO2 21 (*)    Glucose, Bld 144 (*)    BUN 35 (*)    Creatinine, Ser 1.36 (*)    GFR calc non Af Amer 49 (*)    GFR calc Af Amer 57 (*)    All other components within normal limits  URINALYSIS, ROUTINE W REFLEX MICROSCOPIC (NOT AT [facility]) -  Abnormal; Notable for the following:    Protein, ur 30 (*)    All other components within normal limits  CBC WITH DIFFERENTIAL/PLATELET  TROPONIN I  URINE MICROSCOPIC-ADD ON    Imaging Review Dg Chest 2 View  08/11/2015  CLINICAL DATA:  Dizziness and weakness this morning. EXAM: CHEST  2 VIEW COMPARISON:  10/08/2013 FINDINGS: The cardiac silhouette, mediastinal and hilar contours are within normal limits and stable. There is mild tortuosity and calcification of the thoracic aorta. The lungs are clear acute process. No pleural effusion. Stable advanced degenerative changes involving both shoulders. IMPRESSION: No acute cardiopulmonary findings. Electronically Signed   By: Marijo Sanes M.D.   On: 08/11/2015 11:59   Ct Head Wo Contrast  08/11/2015  CLINICAL DATA:  Dizziness and nausea for 1 day EXAM: CT HEAD WITHOUT CONTRAST TECHNIQUE: Contiguous axial images were obtained from the base of the skull through the vertex without intravenous contrast. COMPARISON:  None. FINDINGS: There is age related volume loss. There is no intracranial mass, hemorrhage, extra-axial fluid collection, or midline shift. There is mild small vessel disease in the centra semiovale bilaterally. Elsewhere gray-white compartments appear normal. No acute infarct is evident. The bony calvarium appears intact.  The mastoid air cells are clear. 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 There is mucosal thickening in several ethmoid air cells anteriorly. IMPRESSION: Age related volume loss with slight periventricular small vessel disease. No acute infarct evident. No hemorrhage or mass effect. Mild ethmoid sinus disease bilaterally. 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 Electronically Signed   By: Lowella Grip III M.D.   On: 08/11/2015 11:56   I have personally reviewed and evaluated these images and lab results as part of my medical decision-making.   EKG Interpretation   Date/Time:  Friday August 11 2015 10:51:48 EDT Ventricular Rate:  57 PR Interval:  156 QRS Duration: 111 QT Interval:  429 QTC Calculation: 418 R Axis:   10 Text Interpretation:  Sinus rhythm Abnormal R-wave progression, early  transition Minimal ST elevation, inferior leads Confirmed by [facility] MD,  Ketzia Guzek [phone]) on 08/11/2015 11:20:53 AM      MDM   Final diagnoses:  Light headed  Dehydration    74 yo M w/ decreased intake yesterday. Dizziness and light headedness today. Non focal neuro exam aside from having an ocular implant so couldn't fully evaluate that cranial nerve. Labs unremarkable. VS with asymptomatic bradycardia. Light headedness improved with fluids. Walking at baseline. No ataxia, weakness while walking in halls. He says he feels '100% better than this morning'.  Doubt acs. i don't think dizziness is related to HR as he is asymptomatic with this HR.   Plan to discharge to follow up with PCP.    New Prescriptions: Discharge Medication List as of 08/11/2015  2:31 PM     I have personally and contemperaneously reviewed labs and imaging and used in my decision making as above.   A medical screening exam was performed and I feel the patient has had an appropriate workup for their chief complaint at this time and likelihood of emergent condition existing is low and thus workup can continue on an outpatient basis.. Their  vital signs are stable. They have been counseled on decision, discharge, follow up and which symptoms necessitate immediate return to the emergency department.  They verbally stated understanding and agreement with plan and discharged in stable condition.   I personally performed the services described in this documentation, which was scribed in my presence. The recorded  information has been reviewed and is accurate.      Joseph Pew, MD 08/11/15 1535

## 2015-08-11 NOTE — ED Notes (Signed)
Ambulated pt in hallway; pt walked without assistance, but slight unsteady gait; pt states he "feels a lot better than this morning"; pt wants to sit in chair on return to the room

## 2015-08-16 IMAGING — US US RENAL
1 series · 14 of 25 positions shown · non-contrast
Comparison: None; correlation CT abdomen and pelvis 02/04/2005

CLINICAL DATA: Acute on chronic kidney disease

EXAM:
RENAL/URINARY TRACT ULTRASOUND COMPLETE

[Series 1: us renal · 0.29mm/px · 14 of 30 slices shown]
[im 1/30]
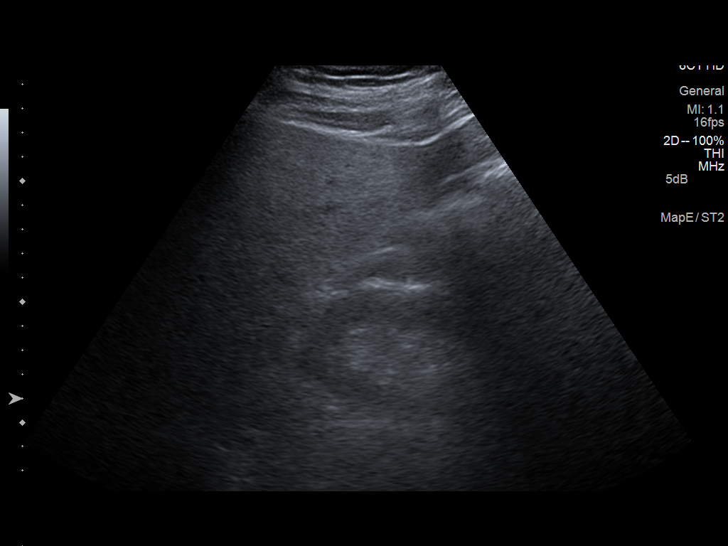
[im 3/30]
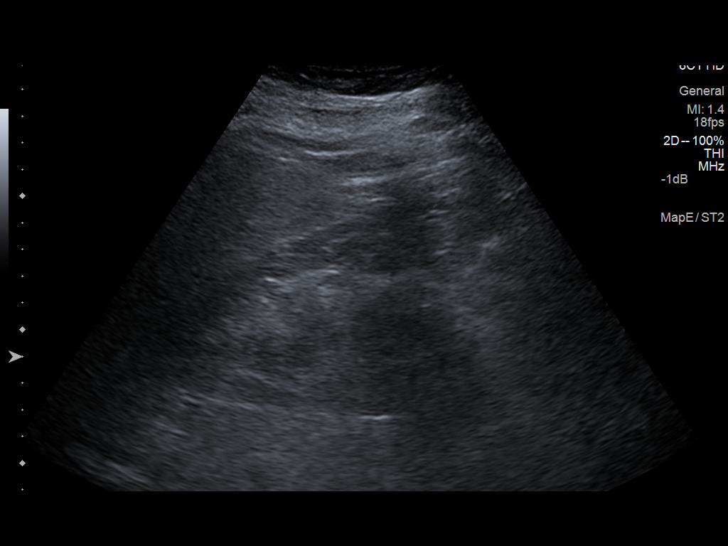
[im 5/30]
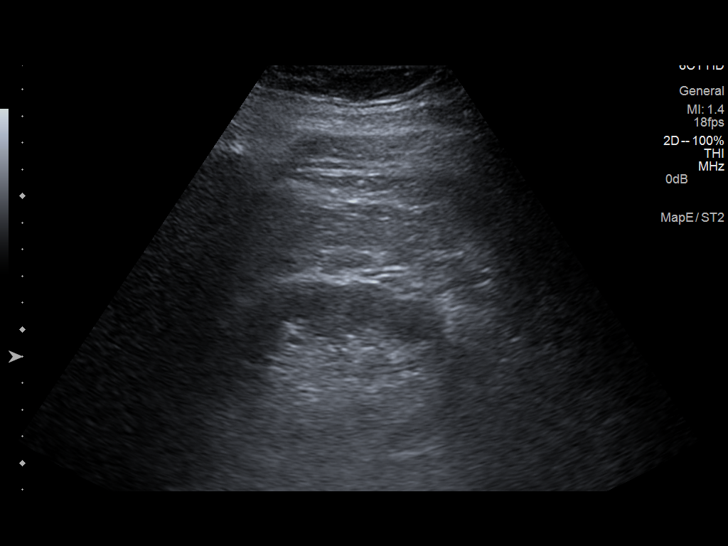
[im 8/30]
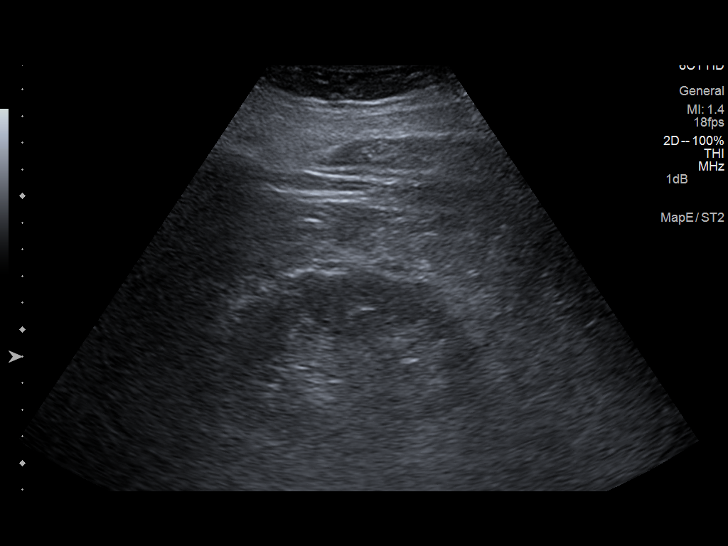
[im 10/30]
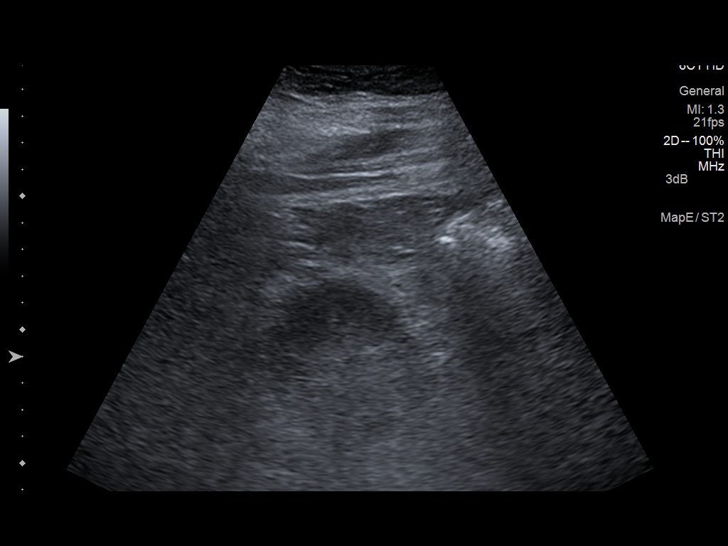
[im 11/30]
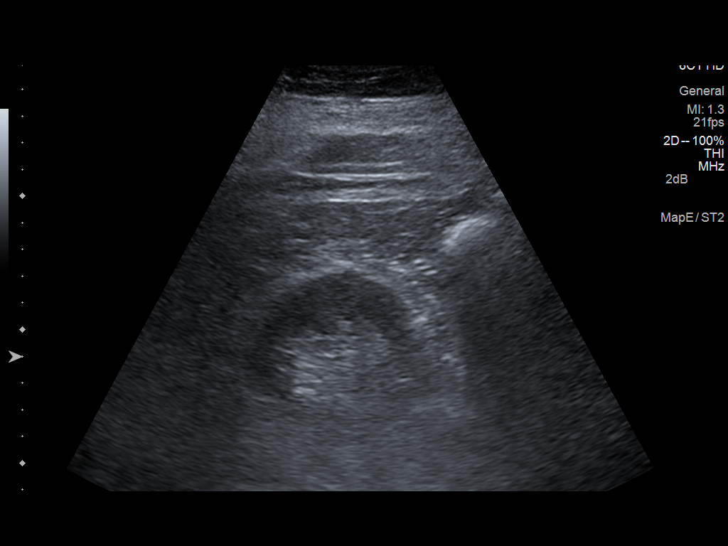
[im 14/30]
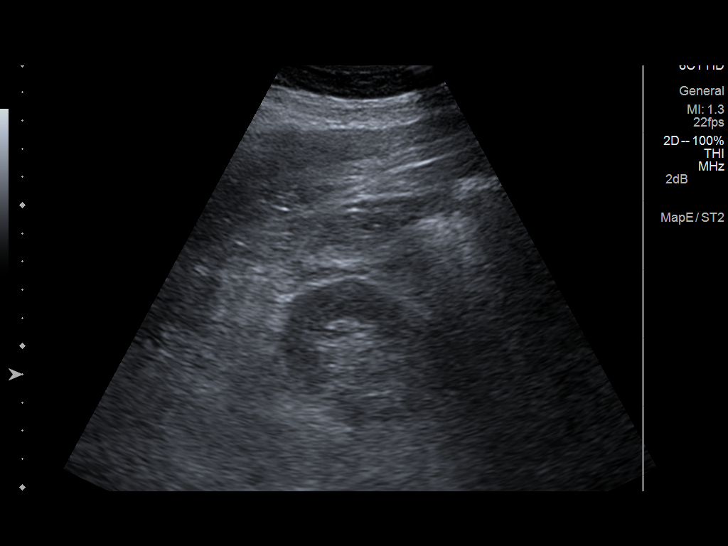
[im 16/30]
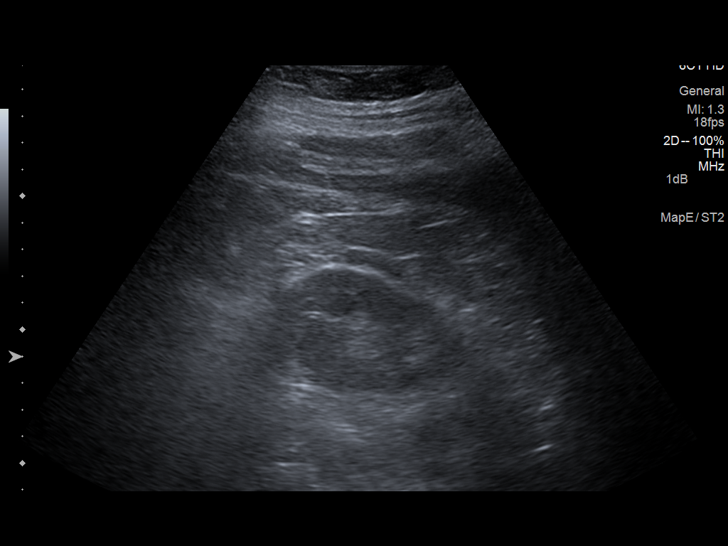
[im 19/30]
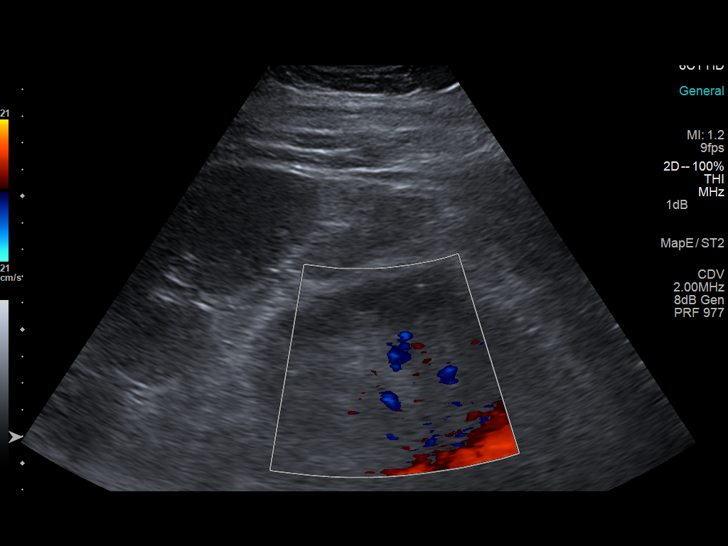
[im 20/30]
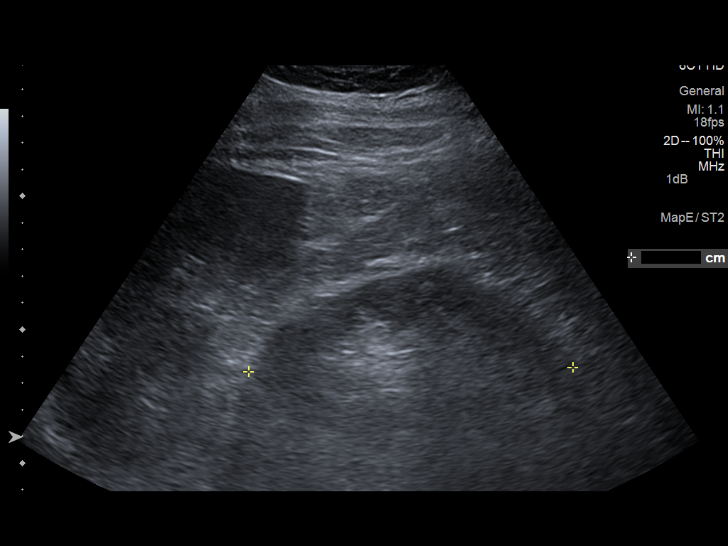
[im 22/30]
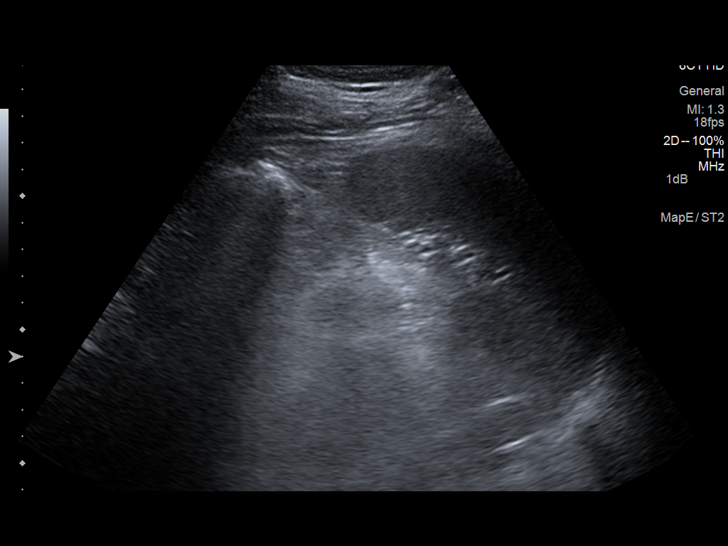
[im 25/30]
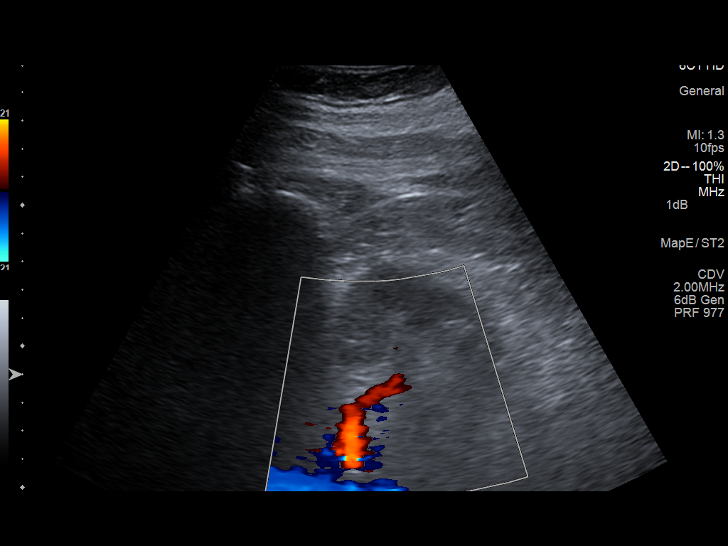
[im 27/30]
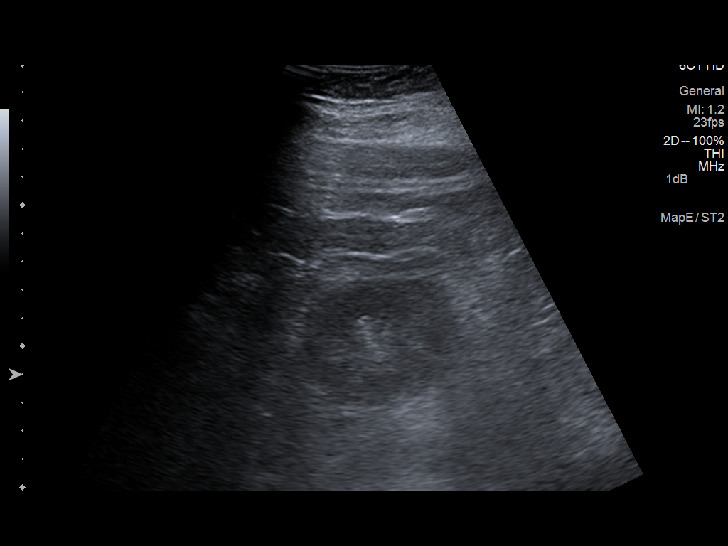
[im 30/30]
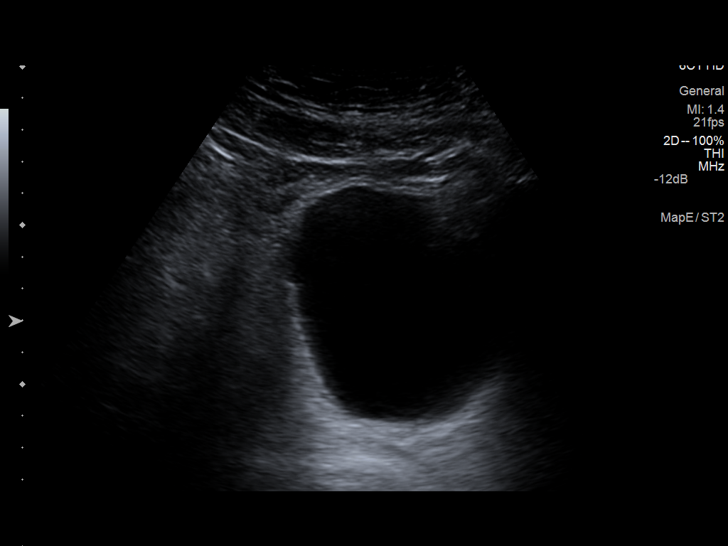

[14 of 25 positions shown; findings below may reference images not displayed]

FINDINGS: Right Kidney:

Length: 9.9 cm. Normal cortical thickness. Slightly increased
cortical echogenicity. No mass, hydronephrosis or shadowing
calcification.

Left Kidney:

Length: 12.1 cm. Normal cortical thickness. Minimally increased
cortical echogenicity. No mass, hydronephrosis or shadowing
calcification.

Bladder:

Normal appearance
IMPRESSION: Minimally increased cortical echogenicity bilaterally suggesting
medical renal disease.

No gross evidence of renal mass or hydronephrosis.

Right kidney smaller than left, chronic finding since [DATE].

## 2016-06-07 ENCOUNTER — Encounter: Payer: Self-pay | Admitting: Cardiology

## 2016-06-07 ENCOUNTER — Ambulatory Visit (INDEPENDENT_AMBULATORY_CARE_PROVIDER_SITE_OTHER): Payer: Medicare Other | Admitting: Cardiology

## 2016-06-07 VITALS — BP 120/80 | HR 58 | Ht 70.0 in | Wt 224.0 lb

## 2016-06-07 DIAGNOSIS — I10 Essential (primary) hypertension: Secondary | ICD-10-CM | POA: Diagnosis not present

## 2016-06-07 DIAGNOSIS — E669 Obesity, unspecified: Secondary | ICD-10-CM | POA: Diagnosis not present

## 2016-06-07 DIAGNOSIS — E785 Hyperlipidemia, unspecified: Secondary | ICD-10-CM | POA: Diagnosis not present

## 2016-06-07 DIAGNOSIS — E119 Type 2 diabetes mellitus without complications: Secondary | ICD-10-CM

## 2016-06-07 DIAGNOSIS — E1169 Type 2 diabetes mellitus with other specified complication: Secondary | ICD-10-CM

## 2016-06-07 MED ORDER — LOSARTAN POTASSIUM 25 MG PO TABS: 25.0000 mg | ORAL_TABLET | Freq: Every day | ORAL | 3 refills | Status: DC

## 2016-06-07 NOTE — Patient Instructions (Signed)
No changes with treatment    Your physician wants you to follow-up in 12 months with DR HARDING. You will receive a reminder letter in the mail two months in advance. If you don't receive a letter, please call our office to schedule the follow-up appointment.   If you need a refill on your cardiac medications before your next appointment, please call your pharmacy.

## 2016-06-07 NOTE — Assessment & Plan Note (Signed)
Unfortunately, he has gained about 9 pounds since his last visit. He is still staying active and exercising, he does need to adjust his dietary modifications to reduce portion size.

## 2016-06-07 NOTE — Assessment & Plan Note (Signed)
Now only taking low-dose Pravachol. Labs being monitored by PCP. With his risk factors of diabetes and hypertension, target LDL should be less than 100. Target HDL should be greater than 40. I do not have the recent labs but they were checked by PCP

## 2016-06-07 NOTE — Assessment & Plan Note (Signed)
Well-controlled on current dose of ARB. May be getting some benefit from Cardura, but this is not prescribed for hypertension

## 2016-06-07 NOTE — Progress Notes (Signed)
PCP: Abigail Miyamoto, MD  Nephrologist: Dr. Arty Baumgartner  Clinic Note: Chief Complaint  Patient presents with  . 24 month visit    pt states no Sx. cardiac risk factors    HPI: [name] is a 75 y.o. male with a PMH below who presents today for 2 year follow-up for cardiac risk factors hypertension, hyperlipidemia and diabetes. No documented history of coronary disease with a Myoview in 2010 was negative.[name] was last seen on 06/06/2014 - workouts daily with exercises and walking 5 miles riding a bike. No chest pain or dyspnea  Recent Hospitalizations: None  Studies Reviewed: None  Interval History: [place] continues to outstanding well. He still doing the same amount of exercise that he was last visit. At least an hour day for 5 days a week. If he walks on treadmill maybe walk 3 miles at a vigorous pace, however. Trail outside, he may go 4-5 miles. He tries to get his heart rate up into the 110-120 bpm range for peak exercise. Besides a brief episode of near heatstroke this summer when he was dehydrated and overheated where he became a little bit lightheaded and dizzy, he is not had any abnormal findings or symptoms. He now knows he needs to be very aggressive with hydration. He has cut out all his caffeine and carbonated beverages. He was having some issues with peripheral neuropathy from his diabetes, but had to stop Lyrica because of intolerance.    Cardiac Review of Symptoms: No chest pain or shortness of breath with rest or exertion. No PND, orthopnea or edema. No palpitations, lightheadedness, dizziness, weakness or syncope/near syncope. No TIA/amaurosis fugax symptoms. No melena, hematochezia, hematuria, or epstaxis. No claudication.  ROS: A comprehensive was performed. Review of Systems  Eyes:       Status post left eye enucleation for retinal cancer in 1974  Neurological:       Mild pedal peripheral neuropathy  All other systems reviewed and are  negative. HENT: Positive for congestion (related to pollen).   Respiratory: Positive for cough (allergies).  Musculoskeletal: Positive for joint pain  much improved acne surgery.  Past Medical History:  Diagnosis Date  . Arthritis   . BPH (benign prostatic hyperplasia)     followed by Dr. Risa Grill  . Cancer ([place]) 1974   tumor on retina of eye  . CKD (chronic kidney disease) stage 3, GFR 30-59 ml/min     Dr. Arty Baumgartner  . Dyslipidemia    On fenofibrate and fish oil  . Hearing loss   . Hernia    from 2006 surgery   . History of kidney stones 15 years ago  . Hx of colonic polyps   . Hypertension, essential, benign   . Hypothyroidism   . Pneumonia 12/14-1/15   04/15/13-continued cough, non productive, no fever  . Staph infection 4 years ago  . Tendonitis of elbow, right   . Tinnitus   . Type II diabetes mellitus, well controlled ([place])    with diet and exercise    Past Surgical History:  Procedure Laterality Date  . [place]   lower back, "2 disc removed"  . BICEPS TENDON REPAIR Right 2004  . COLONOSCOPY W/ POLYPECTOMY  2012  . EYE SURGERY Left 1974   eye removed for eye cancer  . NM MYOCAR PERF WALL MOTION  09/13/2008   normal  . SHOULDER ARTHROSCOPY Left   . SMALL INTESTINE SURGERY  2006   "took everything  out and fixed it"  . TONSILLECTOMY  1970's  . TOTAL KNEE ARTHROPLASTY Left 04/22/2013   Procedure: LEFT TOTAL KNEE ARTHROPLASTY;  Surgeon: Johnn Hai, MD;  Location: [facility];  Service: Orthopedics;  Laterality: Left;  . TOTAL KNEE ARTHROPLASTY Right 10/21/2013   Procedure: TOTAL RIGHT  KNEE ARTHROPLASTY;  Surgeon: Johnn Hai, MD;  Location: [facility];  Service: Orthopedics;  Laterality: Right;  . US ECHOCARDIOGRAPHY  09/13/2008   mild centrially directed MR, mild TR, boderline aortic root dilatation.  Marland Kitchen VASECTOMY  1975    Current Meds  Medication Sig  . aspirin EC 81 MG tablet Take 81 mg by mouth daily.  . Cholecalciferol (VITAMIN D3) 5000 units TABS  Take 1 tablet by mouth daily.  Marland Kitchen doxazosin (CARDURA) 4 MG tablet Take 4 mg by mouth at bedtime.  Marland Kitchen FLUOCINOLONE ACETONIDE SCALP 0.01 % OIL Apply 1 application topically once a week.  . fluticasone (FLONASE) 50 MCG/ACT nasal spray Place 2 sprays into both nostrils daily.  Marland Kitchen levothyroxine (SYNTHROID, LEVOTHROID) 100 MCG tablet Take 100 mcg by mouth daily before breakfast.  . losartan (COZAAR) 25 MG tablet Take 1 tablet (25 mg total) by mouth daily.  Marland Kitchen oxybutynin (DITROPAN-XL) 5 MG 24 hr tablet Take 10 mg by mouth at bedtime.   . pravastatin (PRAVACHOL) 10 MG tablet Take 1 tablet by mouth at bedtime.   . tamsulosin (FLOMAX) 0.4 MG CAPS capsule Take 1 capsule by mouth every morning.  . Vitamin D, Ergocalciferol, (DRISDOL) 50000 units CAPS capsule Take 1 capsule by mouth every 30 (thirty) days.  . [DISCONTINUED] losartan (COZAAR) 25 MG tablet Take 1 tablet (25 mg total) by mouth daily. <PLEASE MAKE APPOINTMENT FOR REFILLS>    Allergies  Allergen Reactions  . Lyrica [Pregabalin]     Social History   Social History  . Marital status: Widowed    Spouse name: N/A  . Number of children: N/A  . Years of education: N/A   Social History Main Topics  . Smoking status: Never Smoker  . Smokeless tobacco: Never Used  . Alcohol use No  . Drug use: No  . Sexual activity: Not Asked   Other Topics Concern  . None   Social History Narrative   He is a widowed father of 62, grandfather and 17 with 2 great grandchildren.  Up until the last few weeks when his knee is really been hurting him, his attorney regular exercise.  He was a member of [employer], and so I am routinely.   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  He enjoys traveling.   He does not smoke, and does not drink alcohol.    family history includes Cancer in his father and mother.  Wt Readings from Last 3 Encounters:  06/07/16 224 lb (101.6 kg)  06/06/14 215 lb 12.8 oz (97.9 kg)  10/21/13  218 lb (98.9 kg)    PHYSICAL EXAM BP 120/80 (BP Location: Left Arm, Patient Position: Sitting, Cuff Size: Normal)   Pulse (!) 58   Ht 5\' 10"  (1.778 m)   Wt 224 lb (101.6 kg)   BMI 32.14 kg/m  General appearance: alert, cooperative, appears stated age, no distress, mildly obese and Otherwise healthy-appearing, well-nourished and well-groomed. Answers questions appropriately. Alert and oriented x3. Neck: no adenopathy, no carotid bruit, no JVD, supple, symmetrical, trachea midline and thyroid not enlarged, symmetric, no tenderness/mass/nodules Lungs: clear to auscultation bilaterally, normal percussion bilaterally and  Nonlabored, good air movement Heart: regular rate and rhythm, S1, S2 normal, no murmur, click, rub or gallop and normal apical impulse Abdomen: soft, non-tender; bowel sounds normal; no masses, no organomegaly and Mild truncal obesity Extremities: extremities normal, atraumatic, no cyanosis or edema, no edema, redness or tenderness in the calves or thighs, no ulcers, gangrene or trophic changes, varicose veins noted and There is mild swelling but no other erythema on the right knee Pulses: 2+ and symmetric    Adult ECG Report  Rate: 58 ;  Rhythm: sinus bradycardia and Otherwise normal axis, intervals and durations;   Narrative Interpretation: Normal EKG   Other studies Reviewed: Additional studies/ records that were reviewed today include:  Recent Labs:  Checked by PCP   ASSESSMENT / PLAN: Problem List Items Addressed This Visit    Dyslipidemia associated with type 2 diabetes mellitus ([place]) (Chronic)    Now only taking low-dose Pravachol. Labs being monitored by PCP. With his risk factors of diabetes and hypertension, target LDL should be less than 100. Target HDL should be greater than 40. I do not have the recent labs but they were checked by PCP      Relevant Medications   losartan (COZAAR) 25 MG tablet   Essential hypertension - Primary (Chronic)     Well-controlled on current dose of ARB. May be getting some benefit from Cardura, but this is not prescribed for hypertension      Relevant Medications   losartan (COZAAR) 25 MG tablet   Other Relevant Orders   EKG 12-Lead   Obesity (BMI 30-39.9) (Chronic)    Unfortunately, he has gained about 9 pounds since his last visit. He is still staying active and exercising, he does need to adjust his dietary modifications to reduce portion size.      Type II diabetes mellitus, well controlled ([place]) (Chronic)    He says he is taking low dose of glipizide or glimepiride & notes that his sugars are well controlled.       Relevant Medications   losartan (COZAAR) 25 MG tablet   Other Relevant Orders   EKG 12-Lead      Current medicines are reviewed at length with the patient today. (+/- concerns) n/a The following changes have been made: n/a  Patient Instructions  No changes with treatment    Your physician wants you to follow-up in 12 months with DR Presly Steinruck. You will receive a reminder letter in the mail two months in advance. If you don't receive a letter, please call our office to schedule the follow-up appointment.   If you need a refill on your cardiac medications before your next appointment, please call your pharmacy.    Studies Ordered:   Orders Placed This Encounter  Procedures  . EKG 12-Lead      Glenetta Hew, M.D., M.S. Interventional Cardiologist   Pager # [phone] Phone # [phone] [address] [place], [place] [address]

## 2016-06-07 NOTE — Assessment & Plan Note (Signed)
He says he is taking low dose of glipizide or glimepiride & notes that his sugars are well controlled.

## 2016-11-26 ENCOUNTER — Other Ambulatory Visit: Payer: Self-pay | Admitting: Family Medicine

## 2016-12-11 ENCOUNTER — Other Ambulatory Visit: Payer: Self-pay | Admitting: Family Medicine

## 2016-12-11 DIAGNOSIS — M5430 Sciatica, unspecified side: Secondary | ICD-10-CM

## 2016-12-19 ENCOUNTER — Ambulatory Visit
Admission: RE | Admit: 2016-12-19 | Discharge: 2016-12-19 | Disposition: A | Discharge status: Home / Self Care | Payer: Medicare Other | Source: Ambulatory Visit | Attending: Family Medicine | Admitting: Family Medicine

## 2016-12-19 DIAGNOSIS — M5430 Sciatica, unspecified side: Secondary | ICD-10-CM

## 2016-12-19 MED ORDER — GADOBENATE DIMEGLUMINE 529 MG/ML IV SOLN
20.0000 mL | Freq: Once | INTRAVENOUS | Status: AC | PRN
  Administered 2016-12-19: 20 mL via INTRAVENOUS

## 2017-06-11 ENCOUNTER — Ambulatory Visit: Payer: Medicare Other | Admitting: Cardiology

## 2017-06-11 ENCOUNTER — Encounter: Payer: Self-pay | Admitting: Cardiology

## 2017-06-11 VITALS — BP 138/70 | HR 64 | Ht 70.0 in | Wt 225.2 lb

## 2017-06-11 DIAGNOSIS — E785 Hyperlipidemia, unspecified: Secondary | ICD-10-CM

## 2017-06-11 DIAGNOSIS — E1169 Type 2 diabetes mellitus with other specified complication: Secondary | ICD-10-CM | POA: Diagnosis not present

## 2017-06-11 DIAGNOSIS — E669 Obesity, unspecified: Secondary | ICD-10-CM | POA: Diagnosis not present

## 2017-06-11 DIAGNOSIS — I10 Essential (primary) hypertension: Secondary | ICD-10-CM | POA: Diagnosis not present

## 2017-06-11 LAB — COMPREHENSIVE METABOLIC PANEL
A/G RATIO: 1.7 (ref 1.2–2.2)
ALT: 16 IU/L (ref 0–44)
AST: 17 IU/L (ref 0–40)
Albumin: 4.4 g/dL (ref 3.5–4.8)
Alkaline Phosphatase: 61 IU/L (ref 39–117)
BUN / CREAT RATIO: 14 (ref 10–24)
BUN: 20 mg/dL (ref 8–27)
Bilirubin Total: 0.3 mg/dL (ref 0.0–1.2)
CALCIUM: 9.6 mg/dL (ref 8.6–10.2)
CO2: 23 mmol/L (ref 20–29)
Chloride: 101 mmol/L (ref 96–106)
Creatinine, Ser: 1.45 mg/dL — ABNORMAL HIGH (ref 0.76–1.27)
GFR, EST AFRICAN AMERICAN: 53 mL/min/{1.73_m2} — AB (ref >59)
GFR, EST NON AFRICAN AMERICAN: 46 mL/min/{1.73_m2} — AB (ref >59)
GLOBULIN, TOTAL: 2.6 g/dL (ref 1.5–4.5)
Glucose: 103 mg/dL — ABNORMAL HIGH (ref 65–99)
POTASSIUM: 4.6 mmol/L (ref 3.5–5.2)
Sodium: 139 mmol/L (ref 134–144)
TOTAL PROTEIN: 7 g/dL (ref 6.0–8.5)

## 2017-06-11 LAB — LIPID PANEL
CHOLESTEROL TOTAL: 170 mg/dL (ref 100–199)
Chol/HDL Ratio: 6.3 ratio — ABNORMAL HIGH (ref 0.0–5.0)
HDL: 27 mg/dL — ABNORMAL LOW (ref >39)
LDL Calculated: 65 mg/dL (ref 0–99)
TRIGLYCERIDES: 391 mg/dL — AB (ref 0–149)
VLDL Cholesterol Cal: 78 mg/dL — ABNORMAL HIGH (ref 5–40)

## 2017-06-11 NOTE — Assessment & Plan Note (Signed)
Currently on low-dose Pravachol.  Has not had labs checked in a while.  We will check fasting today chemistry levels.  Target LDL for him should probably should be less than 100 and closer to 70

## 2017-06-11 NOTE — Progress Notes (Signed)
PCP: Orpah Melter, MD  Clinic Note: Chief Complaint  Patient presents with  . Follow-up    pt c/o no chest pain    HPI: [name] is a 77 y.o. male with a PMH below who presents today for one-year follow-up he does not have a documented history of coronary disease and has had a negative Myoview in 2010.  [name] was last seen on June 07, 2016.  Was doing very well.  Still walks on a treadmill for at least 3 miles at a time & workouts daily with exercises and walking 5 miles riding a bike. No chest pain or dyspnea  Recent Hospitalizations: none  Studies Personally Reviewed - (if available, images/films reviewed: From Epic Chart or Care Everywhere)  none  Interval History: [name] returns today still doing very well.  He says that his back pain is limiting him from riding his bike anymore.  But he still does the treadmill 1 and daily walking.  He likes to go to the [facility] and do that as opposed to doing it outside now. He denies any active cardiac symptoms and is quite happy with how well he is doing.  He is focusing on trying to avoid gaining weight, staying physically and mentally active.  No chest pain or shortness of breath with rest or exertion.  No PND, orthopnea or edema.  No palpitations, lightheadedness, dizziness, weakness or syncope/near syncope. No TIA/amaurosis fugax symptoms. No melena, hematochezia, hematuria, or epstaxis. No claudication.  ROS: A comprehensive was performed. Review of Systems  Constitutional: Negative for chills, fever and malaise/fatigue.  HENT: Negative for congestion and nosebleeds.   Respiratory: Negative for cough, shortness of breath and wheezing.   Cardiovascular: Negative for claudication.  Gastrointestinal: Negative for abdominal pain, blood in stool, constipation and melena.  Genitourinary: Negative for hematuria.       Frequent nocturia - ~q 2hr  Musculoskeletal: Positive for back pain. Negative for joint pain (none since  TKA; Bilateral shoulder OA).  Neurological: Negative for tingling (no longer troubled with LE PN).  Endo/Heme/Allergies: Positive for environmental allergies (Pollen bothers him).  Psychiatric/Behavioral: Negative for depression. The patient does not have insomnia ([place]).   All other systems reviewed and are negative.   I have reviewed and (if needed) personally updated the patient's problem list, medications, allergies, past medical and surgical history, social and family history.   Past Medical History:  Diagnosis Date  . Arthritis   . BPH (benign prostatic hyperplasia)     followed by Dr. Risa Grill  . Cancer ([place]) 1974   tumor on retina of eye  . CKD (chronic kidney disease) stage 3, GFR 30-59 ml/min ([facility])     Dr. Arty Baumgartner  . Dyslipidemia    On fenofibrate and fish oil  . Hearing loss   . Hernia    from 2006 surgery   . History of kidney stones 15 years ago  . Hx of colonic polyps   . Hypertension, essential, benign   . Hypothyroidism   . Pneumonia 12/14-1/15   04/15/13-continued cough, non productive, no fever  . Staph infection 4 years ago  . Tendonitis of elbow, right   . Tinnitus   . Type II diabetes mellitus, well controlled ([place])    with diet and exercise    Past Surgical History:  Procedure Laterality Date  . [place]   lower back, "2 disc removed"  . BICEPS TENDON REPAIR Right 2004  . COLONOSCOPY W/ POLYPECTOMY  2012  .  EYE SURGERY Left 1974   eye removed for eye cancer  . NM MYOCAR PERF WALL MOTION  09/13/2008   normal  . SHOULDER ARTHROSCOPY Left   . SMALL INTESTINE SURGERY  2006   "took everything out and fixed it"  . TONSILLECTOMY  1970's  . TOTAL KNEE ARTHROPLASTY Left 04/22/2013   Procedure: LEFT TOTAL KNEE ARTHROPLASTY;  Surgeon: Johnn Hai, MD;  Location: [facility];  Service: Orthopedics;  Laterality: Left;  . TOTAL KNEE ARTHROPLASTY Right 10/21/2013   Procedure: TOTAL RIGHT  KNEE ARTHROPLASTY;  Surgeon: Johnn Hai, MD;   Location: [facility];  Service: Orthopedics;  Laterality: Right;  . US ECHOCARDIOGRAPHY  09/13/2008   mild centrially directed MR, mild TR, boderline aortic root dilatation.  Marland Kitchen VASECTOMY  1975    Current Meds  Medication Sig  . aspirin EC 81 MG tablet Take 81 mg by mouth daily.  . Cholecalciferol (VITAMIN D3) 5000 units TABS Take 1 tablet by mouth daily.  Marland Kitchen doxazosin (CARDURA) 4 MG tablet Take 4 mg by mouth at bedtime.  Marland Kitchen FLUOCINOLONE ACETONIDE SCALP 0.01 % OIL Apply 1 application topically once a week.  . fluticasone (FLONASE) 50 MCG/ACT nasal spray Place 2 sprays into both nostrils daily.  Marland Kitchen levothyroxine (SYNTHROID, LEVOTHROID) 100 MCG tablet Take 100 mcg by mouth daily before breakfast.  . losartan (COZAAR) 25 MG tablet Take 1 tablet (25 mg total) by mouth daily.  Marland Kitchen oxybutynin (DITROPAN-XL) 5 MG 24 hr tablet Take 10 mg by mouth at bedtime.   . pravastatin (PRAVACHOL) 10 MG tablet Take 1 tablet by mouth at bedtime.   . tamsulosin (FLOMAX) 0.4 MG CAPS capsule Take 1 capsule by mouth every morning.  . Vitamin D, Ergocalciferol, (DRISDOL) 50000 units CAPS capsule Take 1 capsule by mouth every 30 (thirty) days.    Allergies  Allergen Reactions  . Lyrica [Pregabalin]     Social History   Tobacco Use  . Smoking status: Never Smoker  . Smokeless tobacco: Never Used  Substance Use Topics  . Alcohol use: No  . Drug use: No   Social History   Social History Narrative   He is a widowed father of 84, grandfather and 65 with 2 great grandchildren.  Up until the last few weeks when his knee is really been hurting him, his attorney regular exercise.  He was a member of [employer], and so I am routinely.   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  He enjoys traveling.   He does not smoke, and does not drink alcohol.    family history includes Cancer in his father and mother.  Wt Readings from Last 3 Encounters:  06/11/17 225 lb 3.2 oz (102.2 kg)    06/07/16 224 lb (101.6 kg)  06/06/14 215 lb 12.8 oz (97.9 kg)    PHYSICAL EXAM BP 138/70   Pulse 64   Ht 5\' 10"  (1.778 m)   Wt 225 lb 3.2 oz (102.2 kg)   BMI 32.31 kg/m  Physical Exam  Constitutional: He is oriented to person, place, and time. He appears well-developed and well-nourished. No distress.  Mildly obese, well groomed  HENT:  Head: Normocephalic and atraumatic.  Neck: No hepatojugular reflux and no JVD present. Carotid bruit is not present.  Cardiovascular: Normal rate, regular rhythm, normal heart sounds, intact distal pulses and normal pulses.  No extrasystoles are present. PMI is not displaced. Exam reveals no gallop and no  friction rub.  No murmur heard. Bilateral LE GSV & SSV dilated with mild Varicose Veins.  Pulmonary/Chest: Effort normal and breath sounds normal. No respiratory distress. He has no wheezes. He has no rales.  Abdominal: Soft. Bowel sounds are normal. He exhibits no distension. There is no tenderness.  Obese abdomen; No HSM  Musculoskeletal: Normal range of motion. He exhibits no edema.  Neurological: He is alert and oriented to person, place, and time.  Psychiatric: He has a normal mood and affect. His behavior is normal. Judgment and thought content normal.  Nursing note and vitals reviewed.   Adult ECG Report  Rate: 64 ;  Rhythm: normal sinus rhythm and normal axis, intervals & durations;   Narrative Interpretation: Normal EKG   Other studies Reviewed: Additional studies/ records that were reviewed today include:  Recent Labs:  n/a   ASSESSMENT / PLAN: Problem List Items Addressed This Visit    Obesity (BMI 30-39.9) (Chronic)    Stable weight for the past year after having gained some weight.  Doing well now with watching what he eats and continuing exercise.  He is hoping to gradually lose weight adjust his diet some.  He is maintaining his level of activity and exercise.      Relevant Orders   Lipid panel   Comprehensive metabolic  panel   Essential hypertension (Chronic)    Blood pressure well controlled today on current medications.  No change.      Relevant Orders   EKG 12-Lead   Comprehensive metabolic panel   Dyslipidemia associated with type 2 diabetes mellitus ([place]) - Primary (Chronic)    Currently on low-dose Pravachol.  Has not had labs checked in a while.  We will check fasting today chemistry levels.  Target LDL for him should probably should be less than 100 and closer to 70      Relevant Orders   EKG 12-Lead   Lipid panel   Comprehensive metabolic panel      I spent a total of 20 minutes with the patient and chart review. >  50% of the time was spent in direct patient consultation.   Current medicines are reviewed at length with the patient today.  (+/- concerns) n/a The following changes have been made:  n/a  Patient Instructions  NO CHANGE WITH MEDICATION   Your physician wants you to follow-up in [place].You will receive a reminder letter in the mail two months in advance. If you don't receive a letter, please call our office to schedule the follow-up appointment.   If you need a refill on your cardiac medications before your next appointment, please call your pharmacy.     Studies Ordered:   Orders Placed This Encounter  Procedures  . Lipid panel  . Comprehensive metabolic panel  . EKG 12-Lead      Glenetta Hew, M.D., M.S. Interventional Cardiologist   Pager # [phone] Phone # [phone] [address] [place], [place] [address]   Thank you for choosing [facility] at [facility]!!

## 2017-06-11 NOTE — Assessment & Plan Note (Signed)
Blood pressure well controlled today on current medications.  No change.

## 2017-06-11 NOTE — Assessment & Plan Note (Signed)
Stable weight for the past year after having gained some weight.  Doing well now with watching what he eats and continuing exercise.  He is hoping to gradually lose weight adjust his diet some.  He is maintaining his level of activity and exercise.

## 2017-06-11 NOTE — Patient Instructions (Addendum)
NO CHANGE WITH MEDICATION   Your physician wants you to follow-up in 12 MONTH WITH DR HARDING.You will receive a reminder letter in the mail two months in advance. If you don't receive a letter, please call our office to schedule the follow-up appointment.   If you need a refill on your cardiac medications before your next appointment, please call your pharmacy.  

## 2017-06-13 ENCOUNTER — Telehealth: Payer: Self-pay | Admitting: *Deleted

## 2017-06-13 DIAGNOSIS — Z79899 Other long term (current) drug therapy: Secondary | ICD-10-CM

## 2017-06-13 DIAGNOSIS — E119 Type 2 diabetes mellitus without complications: Secondary | ICD-10-CM

## 2017-06-13 DIAGNOSIS — E1169 Type 2 diabetes mellitus with other specified complication: Secondary | ICD-10-CM

## 2017-06-13 DIAGNOSIS — E785 Hyperlipidemia, unspecified: Principal | ICD-10-CM

## 2017-06-13 NOTE — Telephone Encounter (Signed)
Spoke to patient. Result given . Verbalized understanding  aware labs will be obtained in 3 months will mail labslip

## 2017-06-13 NOTE — Telephone Encounter (Signed)
-----   Message from Leonie Man, MD sent at 06/11/2017 10:40 PM EDT ----- Lipid Panel -- TC & LDL look good.  HDL is low (probably related to genetics - the only tru way to increase this is exercise TG is also quite high -- may be related to high starch diet vs. Diabetes  This may be why he was on Fenofibrate in the past - we may want to reconsider starting it back.  Pay attention to dietary intake - reduce sugars & starches.  --> recheck Lipids, BMP & A1c in 3 months  Glenetta Hew, MD

## 2017-08-01 ENCOUNTER — Other Ambulatory Visit: Payer: Self-pay | Admitting: Cardiology

## 2017-08-01 NOTE — Telephone Encounter (Signed)
Rx sent to pharmacy   

## 2017-08-26 ENCOUNTER — Telehealth: Payer: Self-pay | Admitting: *Deleted

## 2017-08-26 ENCOUNTER — Other Ambulatory Visit: Payer: Self-pay | Admitting: *Deleted

## 2017-08-26 DIAGNOSIS — E1169 Type 2 diabetes mellitus with other specified complication: Secondary | ICD-10-CM

## 2017-08-26 DIAGNOSIS — E119 Type 2 diabetes mellitus without complications: Secondary | ICD-10-CM

## 2017-08-26 DIAGNOSIS — E785 Hyperlipidemia, unspecified: Secondary | ICD-10-CM

## 2017-08-26 DIAGNOSIS — Z79899 Other long term (current) drug therapy: Secondary | ICD-10-CM

## 2017-08-26 NOTE — Telephone Encounter (Signed)
Mail letter and labslip- bmp , hgba1c,lipid

## 2017-08-26 NOTE — Telephone Encounter (Signed)
-----   Message from Raiford Simmonds, RN sent at 06/13/2017  2:51 PM EDT ----- Labs due 09/12/17  mail @7 /1/19 bmp , lipid  ,hgba1c

## 2018-07-03 ENCOUNTER — Telehealth: Payer: Self-pay | Admitting: Cardiology

## 2018-07-03 NOTE — Telephone Encounter (Signed)
LVM for patient to call and schedule 1 year with Dr. Ellyn Hack.

## 2018-07-06 ENCOUNTER — Telehealth: Payer: Self-pay | Admitting: Cardiology

## 2018-07-06 NOTE — Telephone Encounter (Signed)
LVM to schedule 1 year followup.

## 2018-11-04 ENCOUNTER — Other Ambulatory Visit: Payer: Self-pay

## 2018-11-04 ENCOUNTER — Ambulatory Visit: Payer: Medicare Other | Admitting: Cardiology

## 2018-11-04 ENCOUNTER — Encounter: Payer: Self-pay | Admitting: Cardiology

## 2018-11-04 VITALS — BP 131/73 | HR 68 | Ht 70.0 in | Wt 221.0 lb

## 2018-11-04 DIAGNOSIS — N183 Chronic kidney disease, stage 3 unspecified: Secondary | ICD-10-CM

## 2018-11-04 DIAGNOSIS — E8881 Metabolic syndrome: Secondary | ICD-10-CM

## 2018-11-04 DIAGNOSIS — E669 Obesity, unspecified: Secondary | ICD-10-CM

## 2018-11-04 DIAGNOSIS — E119 Type 2 diabetes mellitus without complications: Secondary | ICD-10-CM

## 2018-11-04 DIAGNOSIS — E1169 Type 2 diabetes mellitus with other specified complication: Secondary | ICD-10-CM | POA: Diagnosis not present

## 2018-11-04 DIAGNOSIS — E785 Hyperlipidemia, unspecified: Secondary | ICD-10-CM | POA: Diagnosis not present

## 2018-11-04 DIAGNOSIS — I10 Essential (primary) hypertension: Secondary | ICD-10-CM

## 2018-11-04 NOTE — Progress Notes (Signed)
PCP: Orpah Melter, MD  Clinic Note: Chief Complaint  Patient presents with   Follow-up    No complaints    HPI: [name] is a 78 y.o. male with a PMH notable for CAD RFs of HTN/HLD& DM-2 who presents for "delayed annual f/u" (delaued 22/ COVID-19 Quarantine). He had a negative Myoview in 2010.  [name] was last seen in May 2019 --> still doing well.  TM 1-2 d/ week & walking - not able to ride bike as much 2/2 back pain.Marland Kitchen No chest pain or dyspnea. No change  Recent Hospitalizations: none  Studies Personally Reviewed - (if available, images/films reviewed: From Epic Chart or Care Everywhere)  none  Interval History: [name] returns today doing well - no complaints.  Milling main thing he is complain about now is that he had a accident while the shooting range with his grandson, and he has had significant hearing loss ever since. He tells me he walks about 3 miles a day most days of the week.  In the heat of the day he has a hard time with that but usually is doing okay without any chest pain or pressure.  He is happy that his nephrologist told him his creatinine is better.s.  Not able to get back to [facility] (where he had been doing 3 miles/day on TM) - now walking up & down driveway (that is ~1 mile long).  Not biking b/c bad back.  Trying to stay active both physically & mentally.   Negative Cardiovascular ROS: no chest pain or dyspnea on exertion positive for - mild L leg V. Veins negative for - edema, irregular heartbeat, orthopnea, palpitations, paroxysmal nocturnal dyspnea, rapid heart rate, shortness of breath or no syncope/near syncope or TIA/amaurosis fugax, claudication  Mild neuropathy..    ROS: A comprehensive was performed. Review of Systems  Constitutional: Negative for chills, fever and malaise/fatigue.  HENT: Negative for congestion and nosebleeds.   Respiratory: Negative for cough, shortness of breath and wheezing.   Cardiovascular: Negative for  claudication.  Gastrointestinal: Negative for abdominal pain, blood in stool, constipation and melena.  Genitourinary: Negative for hematuria.       Frequent nocturia - ~q 2hr  Musculoskeletal: Positive for back pain. Negative for joint pain (none since TKA; Bilateral shoulder OA).  Neurological: Negative for tingling (no longer troubled with LE PN).  Endo/Heme/Allergies: Positive for environmental allergies (Pollen bothers him).  Psychiatric/Behavioral: Negative for depression and memory loss. The patient is not nervous/anxious and does not have insomnia ([place]).   All other systems reviewed and are negative.  The patient does not have symptoms concerning for COVID-19 infection (fever, chills, cough, or new shortness of breath).  The patient is practicing social distancing.  Just does not go out as much.  Is upset that he cannot get back to the [facility].  COVID-19 Education: The signs and symptoms of COVID-19 were discussed with the patient and how to seek care for testing (follow up with PCP or arrange E-visit).   The importance of social distancing was discussed today.   I have reviewed and (if needed) personally updated the patient's problem list, medications, allergies, past medical and surgical history, social and family history.   Past Medical History:  Diagnosis Date   Arthritis    BPH (benign prostatic hyperplasia)     followed by Dr. 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 Arty Baumgartner   Dyslipidemia    On fenofibrate and fish oil   Hearing loss    Hernia    from 2006 surgery    History of kidney stones 15 years ago   Hx of colonic polyps    Hypertension, essential, benign    Hypothyroidism    Pneumonia 12/14-1/15   04/15/13-continued cough, non productive, no fever   Staph infection 4 years ago   Tendonitis of elbow, right    Tinnitus    Type II diabetes mellitus, well  controlled ([place])    with diet and exercise    Past Surgical History:  Procedure Laterality Date   [place]   lower back, "2 disc removed"   BICEPS TENDON REPAIR Right 2004   COLONOSCOPY W/ POLYPECTOMY  2012   EYE SURGERY Left 1974   eye removed for eye cancer   NM MYOCAR PERF WALL MOTION  09/13/2008   normal   SHOULDER ARTHROSCOPY Left    SMALL INTESTINE SURGERY  2006   "took everything out and fixed it"   TONSILLECTOMY  1970's   TOTAL KNEE ARTHROPLASTY Left 04/22/2013   Procedure: LEFT TOTAL KNEE ARTHROPLASTY;  Surgeon: Johnn Hai, MD;  Location: [facility];  Service: Orthopedics;  Laterality: Left;   TOTAL KNEE ARTHROPLASTY Right 10/21/2013   Procedure: TOTAL RIGHT  KNEE ARTHROPLASTY;  Surgeon: Johnn Hai, MD;  Location: [facility];  Service: Orthopedics;  Laterality: Right;   US ECHOCARDIOGRAPHY  09/13/2008   mild centrially directed MR, mild TR, boderline aortic root dilatation.   VASECTOMY  1975    Current Meds  Medication Sig   aspirin EC 81 MG tablet Take 81 mg by mouth daily.   Cholecalciferol (VITAMIN D3) 5000 units TABS Take 1 tablet by mouth daily.   doxazosin (CARDURA) 4 MG tablet Take 4 mg by mouth at bedtime.   FLUOCINOLONE ACETONIDE SCALP 0.01 % OIL Apply 1 application topically once a week.   fluticasone (FLONASE) 50 MCG/ACT nasal spray Place 2 sprays into both nostrils daily.   levothyroxine (SYNTHROID, LEVOTHROID) 100 MCG tablet Take 100 mcg by mouth daily before breakfast.   losartan (COZAAR) 25 MG tablet TAKE 1 TABLET(25 MG) BY MOUTH DAILY   oxybutynin (DITROPAN-XL) 5 MG 24 hr tablet Take 10 mg by mouth at bedtime.    pravastatin (PRAVACHOL) 10 MG tablet Take 1 tablet by mouth at bedtime.    tamsulosin (FLOMAX) 0.4 MG CAPS capsule Take 1 capsule by mouth every morning.   Vitamin D, Ergocalciferol, (DRISDOL) 50000 units CAPS capsule Take 1 capsule by mouth every 30 (thirty) days.    Allergies  Allergen Reactions   Lyrica  [Pregabalin]     Social History   Tobacco Use   Smoking status: Never Smoker   Smokeless tobacco: Never Used  Substance Use Topics   Alcohol use: No   Drug use: No   Social History   Social History Narrative   He is a widowed father of 35, grandfather and 35 with 2 great grandchildren.  Up until the last few weeks when his knee is really been hurting him, his attorney regular exercise.  He was a member of [employer], and so I am routinely.   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  He enjoys traveling.   He does not smoke, and does not drink alcohol.    family history includes Cancer  in his father and mother.  Wt Readings from Last 3 Encounters:  11/04/18 221 lb (100.2 kg)  06/11/17 225 lb 3.2 oz (102.2 kg)  06/07/16 224 lb (101.6 kg)    PHYSICAL EXAM BP 131/73    Pulse 68    Ht 5\' 10"  (1.778 m)    Wt 221 lb (100.2 kg)    SpO2 94%    BMI 31.71 kg/m  Physical Exam  Constitutional: He is oriented to person, place, and time. He appears well-developed and well-nourished. No distress.  Mildly obese, well groomed  HENT:  Head: Normocephalic and atraumatic.  Chronic left eye ptosis  Neck: Normal range of motion. Neck supple. No hepatojugular reflux and no JVD present. Carotid bruit is not present.  Cardiovascular: Normal rate, regular rhythm, normal heart sounds, intact distal pulses and normal pulses.  No extrasystoles are present. PMI is not displaced. Exam reveals no gallop and no friction rub.  No murmur heard. Bilateral LE GSV & SSV dilated with mild Varicose Veins.  Pulmonary/Chest: Effort normal and breath sounds normal. No respiratory distress. He has no wheezes. He has no rales.  Abdominal: Soft. Bowel sounds are normal. He exhibits no distension. There is no abdominal tenderness.  Obese abdomen; No HSM  Musculoskeletal: Normal range of motion.        General: No edema.  Neurological: He is alert and oriented to person,  place, and time.  Psychiatric: He has a normal mood and affect. His behavior is normal. Judgment and thought content normal.  Nursing note and vitals reviewed.   Adult ECG Report  Rate: 68 ;  Rhythm: normal sinus rhythm and normal axis, intervals & durations;   Narrative Interpretation: Normal EKG   Other studies Reviewed: Additional studies/ records that were reviewed today include:  Recent Labs:  Labs from 01/2018   TC 155, TG 371 (high).  HDL (low) 29.  LDL 52.  A1c 7.4.  Cr 1.23.  K+ 4.6.  TSH 1.38.  Normal LFTs hemoglobin 15.2.  Platelets 193.   ASSESSMENT / PLAN: Problem List Items Addressed This Visit    Type II diabetes mellitus, well controlled ([place]) (Chronic)    He is taking glimepiride.  Would consider converting from sulfonylurea to SGLT 2 inhibitor such as [place] or Jardiance      Obesity (BMI 30-39.9) (Chronic)    His weight is down a little bit from last year.  He says he is actually lost some weight over the last few weeks but again some during the COVID-19 lockdown. Continue to address diet and hopefully once the COVID restrictions were loosened, he will get back into more vigorous exercise..      Relevant Orders   EKG [id number]   Metabolic syndrome (Chronic)    With the combination of hypertension, tract hypertriglyceridemia and obesity as well as diabetes, he more than meets criteria for metabolic syndrome.  We discussed the increased risk of cardiovascular disease.  This is the main reason for him to continue to follow with [place].  Continue aggressive management of these underlying features.  In the absence of any cardiac symptoms, I think we are okay for now not evaluating, however may want to consider in the future evaluate with a coronary calcium score for stratification..      Essential hypertension (Chronic)    Well-controlled.  Remains on low-dose losartan.  Appropriate with him being diabetic.      Relevant Orders   EKG 12-Lead   Dyslipidemia associated  with type 2  diabetes mellitus ([place]) - Primary (Chronic)    LDL is 52, and HDL is little low at 29.  These concerning his triglycerides 371.  This goes along with diabetes.  We talked about dietary adjustment.      Relevant Orders   EKG 12-Lead   CKD (chronic kidney disease) stage 3, GFR 30-59 ml/min ([facility])      I spent a total of 20 minutes with the patient and chart review. >  50% of the time was spent in direct patient consultation.   Current medicines are reviewed at length with the patient today.  (+/- concerns) n/a The following changes have been made:  n/a  Patient Instructions  Medication Instructions:  NO CHANGES  If you need a refill on your cardiac medications before your next appointment, please call your pharmacy.   Lab work: NOT NEED .  Testing/Procedures: NOT NEEDED  Follow-Up: At [facility], you and your health needs are our priority.  As part of our continuing mission to provide you with exceptional heart care, we have created designated Provider Care Teams.  These Care Teams include your primary Cardiologist (physician) and Advanced Practice Providers (APPs -  Physician Assistants and Nurse Practitioners) who all work together to provide you with the care you need, when you need it.  You will need a follow up appointment in 12   Months- SEPT 2021.  Please call our office 2 months in advance to schedule this appointment.  You may see Glenetta Hew, MD or one of the following Advanced Practice Providers on your designated Care Team:    Rosaria Ferries, PA-C  Jory Sims, DNP, ANP  Any Other Special Instructions Will Be Listed Below.    Studies Ordered:   Orders Placed This Encounter  Procedures   EKG 12-Lead      Glenetta Hew, M.D., M.S. Interventional Cardiologist   Pager # [phone] Phone # [phone] [address] [place], [place] [address]   Thank you for choosing Heartcare at [facility]!!

## 2018-11-04 NOTE — Patient Instructions (Addendum)
Medication Instructions:  NO CHANGES  If you need a refill on your cardiac medications before your next appointment, please call your pharmacy.   Lab work: NOT NEED .  Testing/Procedures: NOT NEEDED  Follow-Up: At [facility], you and your health needs are our priority.  As part of our continuing mission to provide you with exceptional heart care, we have created designated Provider Care Teams.  These Care Teams include your primary Cardiologist (physician) and Advanced Practice Providers (APPs -  Physician Assistants and Nurse Practitioners) who all work together to provide you with the care you need, when you need it. . You will need a follow up appointment in 12   Months- SEPT 2021.  Please call our office 2 months in advance to schedule this appointment.  You may see Glenetta Hew, MD or one of the following Advanced Practice Providers on your designated Care Team:   . Rosaria Ferries, PA-C . Jory Sims, DNP, ANP  Any Other Special Instructions Will Be Listed Below.

## 2018-11-05 ENCOUNTER — Encounter: Payer: Self-pay | Admitting: Cardiology

## 2018-11-05 DIAGNOSIS — E8881 Metabolic syndrome: Secondary | ICD-10-CM | POA: Insufficient documentation

## 2018-11-05 NOTE — Assessment & Plan Note (Signed)
LDL is 52, and HDL is little low at 29.  These concerning his triglycerides 371.  This goes along with diabetes.  We talked about dietary adjustment.

## 2018-11-05 NOTE — Assessment & Plan Note (Signed)
Well-controlled.  Remains on low-dose losartan.  Appropriate with him being diabetic.

## 2018-11-05 NOTE — Assessment & Plan Note (Signed)
He is taking glimepiride.  Would consider converting from sulfonylurea to SGLT 2 inhibitor such as [place] or Jardiance

## 2018-11-05 NOTE — Assessment & Plan Note (Signed)
His weight is down a little bit from last year.  He says he is actually lost some weight over the last few weeks but again some during the COVID-19 lockdown. Continue to address diet and hopefully once the COVID restrictions were loosened, he will get back into more vigorous exercise.Marland Kitchen

## 2018-11-05 NOTE — Assessment & Plan Note (Signed)
With the combination of hypertension, tract hypertriglyceridemia and obesity as well as diabetes, he more than meets criteria for metabolic syndrome.  We discussed the increased risk of cardiovascular disease.  This is the main reason for him to continue to follow with [place].  Continue aggressive management of these underlying features.  In the absence of any cardiac symptoms, I think we are okay for now not evaluating, however may want to consider in the future evaluate with a coronary calcium score for stratification.Marland Kitchen

## 2019-04-11 IMAGING — MR MR LUMBAR SPINE WO/W CM
7 series · 48 of 48 positions shown · IV contrast (multihance)
Comparison: None.

CLINICAL DATA: Back pain with right leg pain.

Creatinine was obtained on site at [HOSPITAL] at [HOSPITAL].
Results: Creatinine 1.2 mg/dL.
EXAM:
MRI LUMBAR SPINE WITHOUT AND WITH CONTRAST
TECHNIQUE: Multiplanar and multiecho pulse sequences of the lumbar spine were
obtained without and with intravenous contrast.
CONTRAST:  20mL MULTIHANCE GADOBENATE DIMEGLUMINE 529 MG/ML IV SOLN

[Series 3: tirm sag · sagittal · 4.0mm · 0.55mm/px · 3 of 15 slices shown]
[im 1/15]
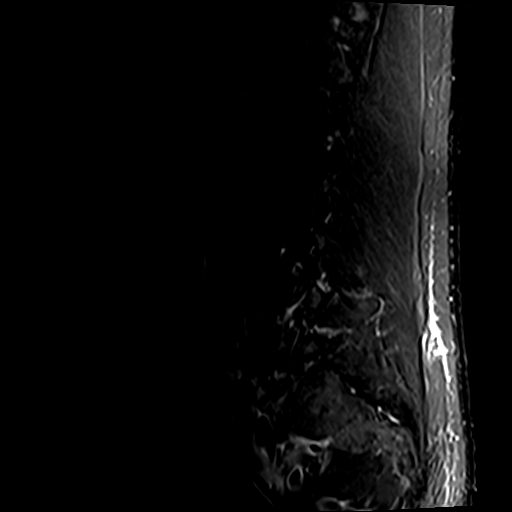
[im 8/15]
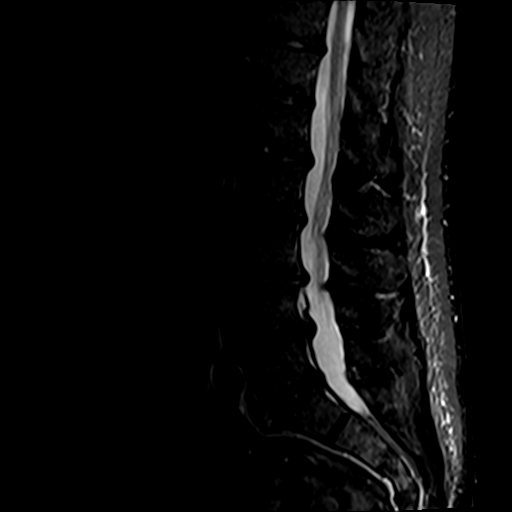
[im 15/15]
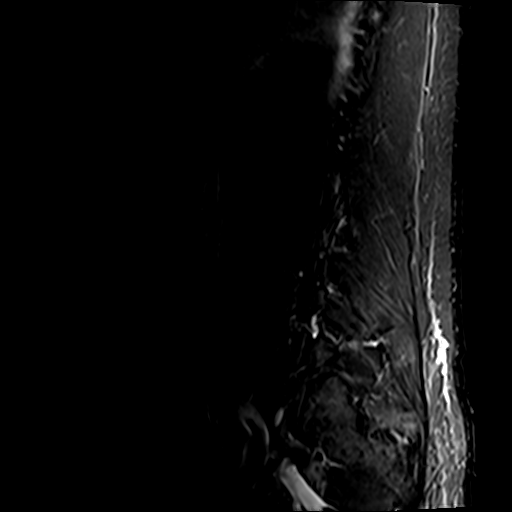

[Series 4: T2 · sagittal · 4.0mm · 0.88mm/px · 4 of 15 slices shown (1 of 2)]
[im 1/15]
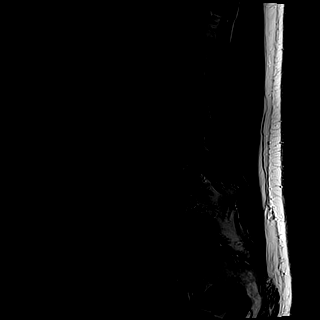
[im 5/15]
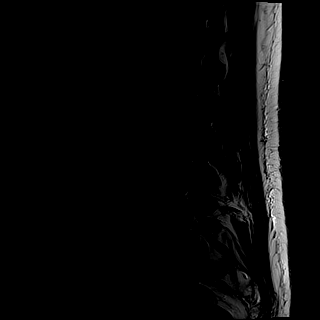
[im 10/15]
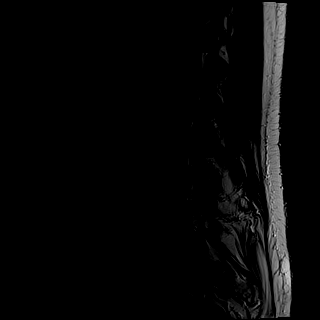
[im 15/15]
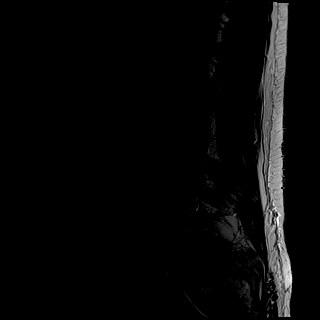

[Series 5: T1 · sagittal · 4.0mm · 0.88mm/px · 4 of 15 slices shown (1 of 2)]
[im 1/15]
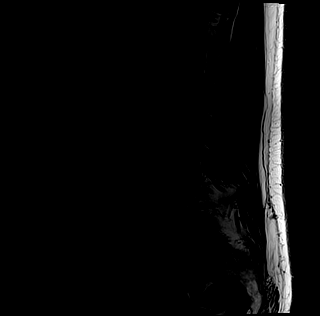
[im 5/15]
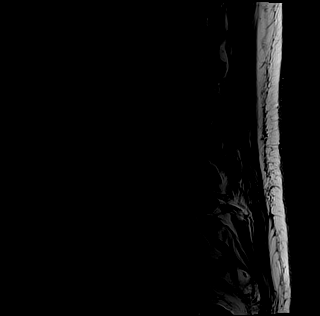
[im 10/15]
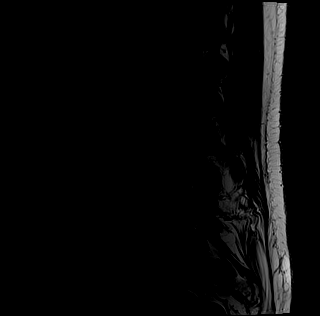
[im 15/15]
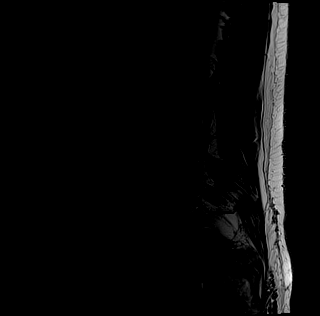

[Series 6: T1 · axial · 4.0mm · 0.78mm/px · z∈[-66,+173]mm · 11 of 40 slices shown (2 of 2)]
[im 1/40]
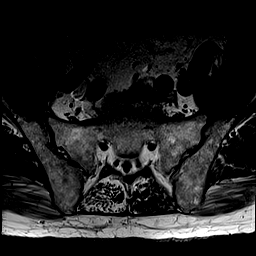
[im 4/40]
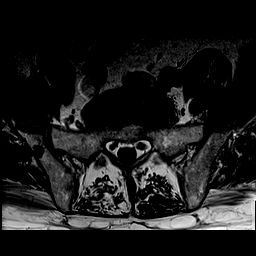
[im 8/40]
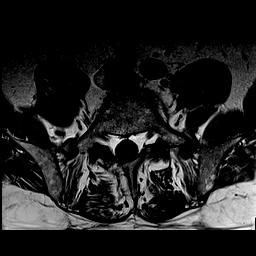
[im 12/40]
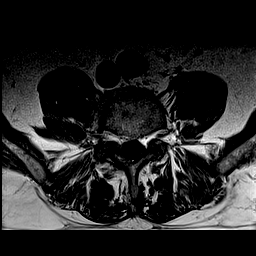
[im 16/40]
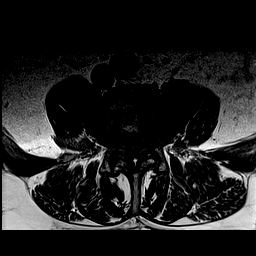
[im 20/40]
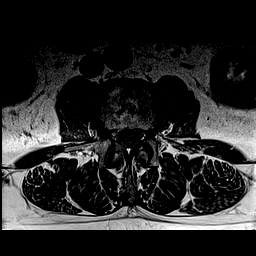
[im 24/40]
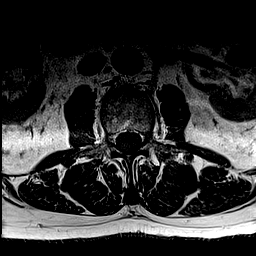
[im 28/40]
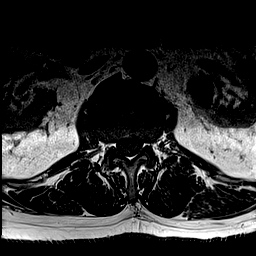
[im 32/40]
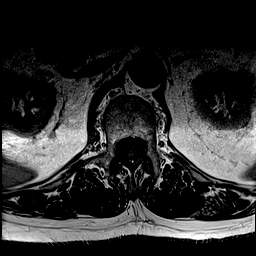
[im 36/40]
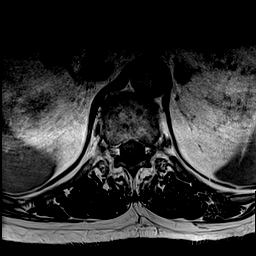
[im 40/40]
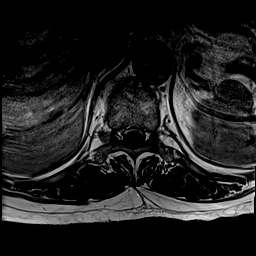

[Series 7: T2 · axial · 4.0mm · 0.78mm/px · z∈[-66,+173]mm · 11 of 40 slices shown (2 of 2)]
[im 1/40]
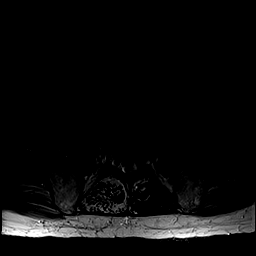
[im 4/40]
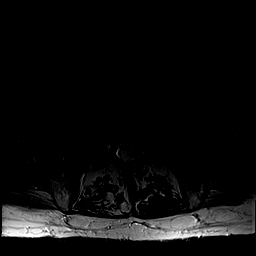
[im 8/40]
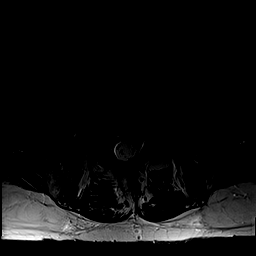
[im 12/40]
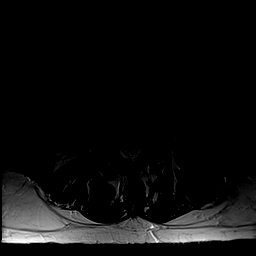
[im 16/40]
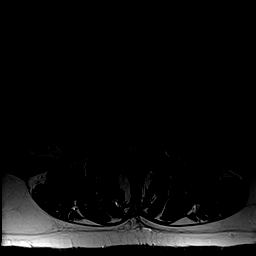
[im 20/40]
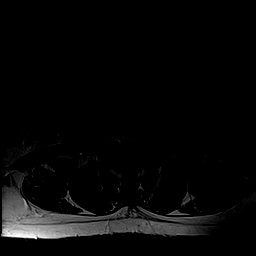
[im 24/40]
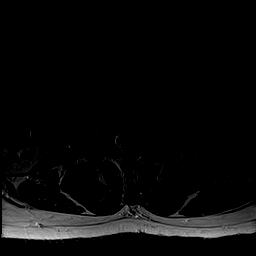
[im 28/40]
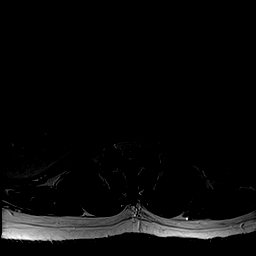
[im 32/40]
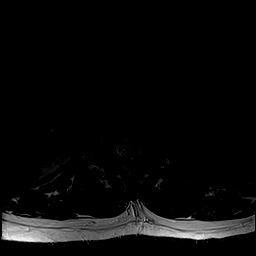
[im 36/40]
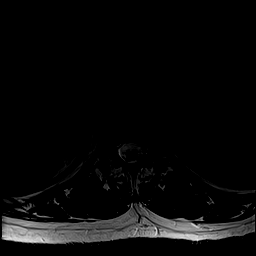
[im 40/40]
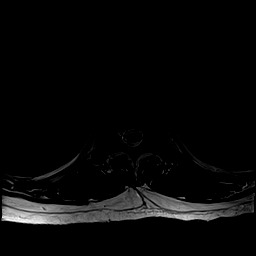

[Series 8: T1 fat-sat post-contrast · sagittal · 4.0mm · 0.88mm/px · 4 of 15 slices shown]
[im 1/15]
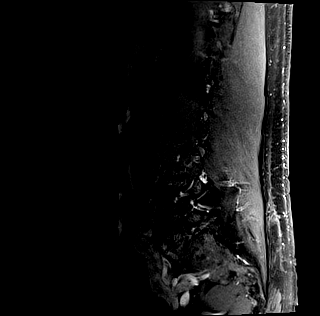
[im 5/15]
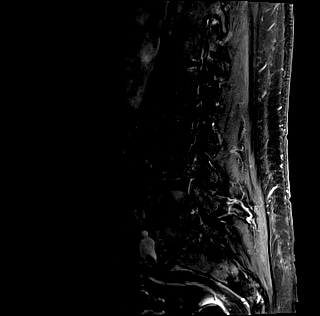
[im 10/15]
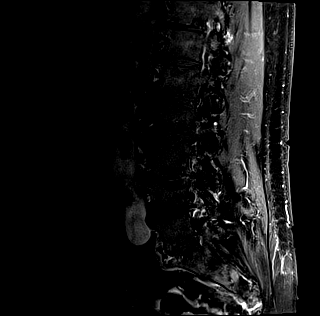
[im 15/15]
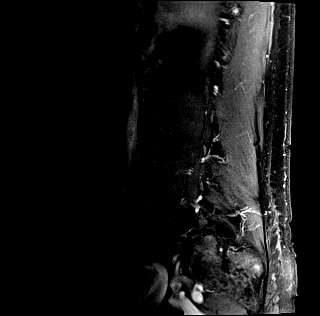

[Series 9: T1 post-contrast · axial · 4.0mm · 0.78mm/px · z∈[-66,+173]mm · 11 of 40 slices shown]
[im 1/40]
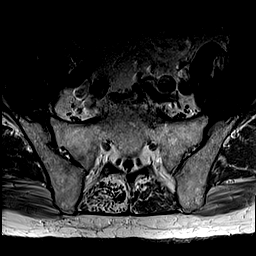
[im 4/40]
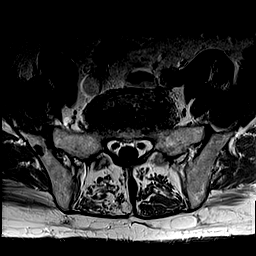
[im 8/40]
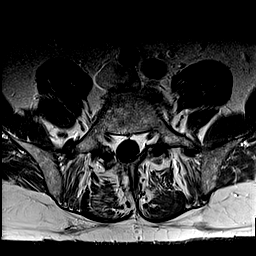
[im 12/40]
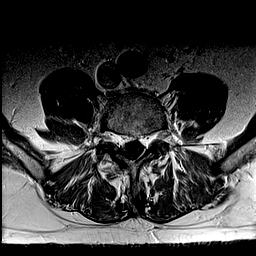
[im 16/40]
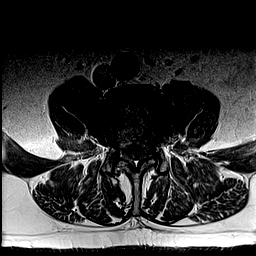
[im 20/40]
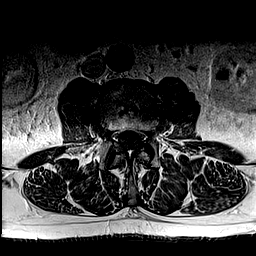
[im 24/40]
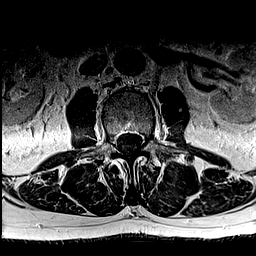
[im 28/40]
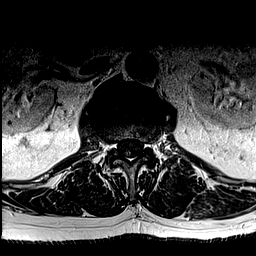
[im 32/40]
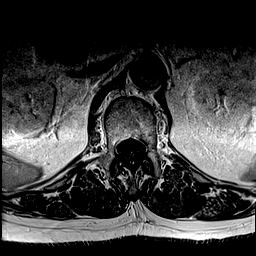
[im 36/40]
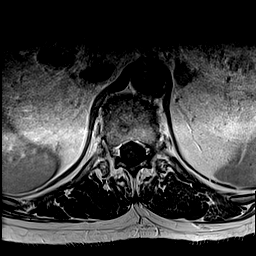
[im 40/40]
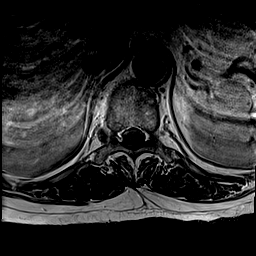

[48 of 48 positions shown; findings below may reference images not displayed]

FINDINGS: Segmentation: Normal. The lowest disc space is considered to be
L5-S1.

Alignment:  Normal

Vertebrae: Remote right L5 laminectomy. No acute compression
fracture, discitis-osteomyelitis, facet edema or other focal marrow
lesion. No epidural collection.

Conus medullaris: Extends to the L1 level and appears normal.

Paraspinal and other soft tissues: The visualized aorta, IVC and
iliac vessels are normal. The visualized retroperitoneal organs and
paraspinal soft tissues are normal.

Disc levels:

T11-T12: Disc space narrowing and mild bulge without stenosis.

T12-L1: Disc space narrowing and minimal bulge without stenosis.
Normal facets.

L1-L2: Disc space narrowing with minimal bulge. No stenosis. Facets
are normal.

L2-L3: Right eccentric disc bulge with focal right foraminal
component. No spinal canal stenosis. Moderate right neural foraminal
stenosis.

L3-L4: Disc bulge without central spinal canal stenosis. Mild
narrowing of the lateral recesses. Mild right and moderate left
neural foraminal stenosis.

L4-L5: Disc space narrowing with small bulge. Moderate bilateral
neural foraminal stenosis. There is also narrowing of the right
lateral recess.

L5-S1: Disc space narrowing with mild bulge. No central spinal canal
stenosis. Mild bilateral foraminal narrowing.

No abnormal contrast enhancement.
IMPRESSION: 1. Diffuse lumbar degenerative disc disease without spinal canal
stenosis.
2. Moderate neural foraminal stenosis at right L2-3, left L3-4 and
bilateral L4-5.
3. Bilateral lateral recess stenosis at L3-4 and mild right lateral
recess stenosis at L4-5.

## 2019-04-29 ENCOUNTER — Ambulatory Visit: Payer: Medicare PPO | Attending: Internal Medicine

## 2019-04-29 DIAGNOSIS — Z23 Encounter for immunization: Secondary | ICD-10-CM

## 2019-04-29 NOTE — Progress Notes (Signed)
   Covid-19 Vaccination Clinic  Name:  [name]    MRN: [id number] DOB: January 13, 1941  04/29/2019  [name] was observed post Covid-19 immunization for 15 minutes without incidence. He was provided with Vaccine Information Sheet and instruction to access the V-Safe system.   [name] was instructed to call 911 with any severe reactions post vaccine: Marland Kitchen Difficulty breathing  . Swelling of your face and throat  . A fast heartbeat  . A bad rash all over your body  . Dizziness and weakness    Immunizations Administered    Name Date Dose VIS Date Route   Pfizer COVID-19 Vaccine 04/29/2019  8:22 AM 0.3 mL 02/12/2019 Intramuscular   Manufacturer: [place]   Lot: [id number]   [place]: [id number]

## 2019-05-25 ENCOUNTER — Ambulatory Visit: Payer: Medicare PPO | Attending: Internal Medicine

## 2019-05-25 DIAGNOSIS — Z23 Encounter for immunization: Secondary | ICD-10-CM

## 2019-05-25 NOTE — Progress Notes (Signed)
   Covid-19 Vaccination Clinic  Name:  [name]    MRN: [id number] DOB: Sep 21, 1940  05/25/2019  [name] was observed post Covid-19 immunization for 15 minutes without incident. He was provided with Vaccine Information Sheet and instruction to access the V-Safe system.   [name] was instructed to call 911 with any severe reactions post vaccine: Marland Kitchen Difficulty breathing  . Swelling of face and throat  . A fast heartbeat  . A bad rash all over body  . Dizziness and weakness   Immunizations Administered    Name Date Dose VIS Date Route   Pfizer COVID-19 Vaccine 05/25/2019  8:27 AM 0.3 mL 02/12/2019 Intramuscular   Manufacturer: [place]   Lot: [id number]   [place]: [id number]

## 2019-07-12 DIAGNOSIS — N1831 Chronic kidney disease, stage 3a: Secondary | ICD-10-CM | POA: Diagnosis not present

## 2019-07-12 DIAGNOSIS — N189 Chronic kidney disease, unspecified: Secondary | ICD-10-CM | POA: Diagnosis not present

## 2019-10-27 DIAGNOSIS — N189 Chronic kidney disease, unspecified: Secondary | ICD-10-CM | POA: Diagnosis not present

## 2019-10-27 DIAGNOSIS — N1831 Chronic kidney disease, stage 3a: Secondary | ICD-10-CM | POA: Diagnosis not present

## 2019-11-05 ENCOUNTER — Other Ambulatory Visit: Payer: Self-pay

## 2019-11-05 ENCOUNTER — Ambulatory Visit: Payer: Medicare PPO | Admitting: Cardiology

## 2019-11-05 ENCOUNTER — Encounter: Payer: Self-pay | Admitting: Cardiology

## 2019-11-05 VITALS — BP 122/72 | HR 56 | Ht 70.0 in | Wt 221.4 lb

## 2019-11-05 DIAGNOSIS — E1169 Type 2 diabetes mellitus with other specified complication: Secondary | ICD-10-CM | POA: Diagnosis not present

## 2019-11-05 DIAGNOSIS — E8881 Metabolic syndrome: Secondary | ICD-10-CM | POA: Diagnosis not present

## 2019-11-05 DIAGNOSIS — E669 Obesity, unspecified: Secondary | ICD-10-CM

## 2019-11-05 DIAGNOSIS — I10 Essential (primary) hypertension: Secondary | ICD-10-CM

## 2019-11-05 DIAGNOSIS — E785 Hyperlipidemia, unspecified: Secondary | ICD-10-CM | POA: Diagnosis not present

## 2019-11-05 NOTE — Progress Notes (Signed)
Primary Care Provider: Orpah Melter, MD Cardiologist: No primary care provider on file. Electrophysiologist: None  Clinic Note: Chief Complaint  Patient presents with  . Follow-up    No major complaints    HPI:    [name] is a 79 y.o. male with a PMH notable for cardiac risk factors including hypertension, hyperlipidemia, DM-2 who presents today for annual follow-up. Most recent ischemic evaluation was a Myoview in 2010 that was negative for ischemia.[name] was last seen on November 04, 2018 -> was doing very well at that time.  Was complaining about having some hearing loss after an outing [place] the shooting range with his grandson -> has noted progressive hearing loss since.  Was walking at least 3 days a week for about 1 or 2 miles depending on the weather.  Limited by walking in the heat of day.  Was upset because of not able to get back to the [facility] where he was doing about 3 miles a day on the treadmill.  Limited now to walking up and down the driveway.  Back limiting him from walking and unable to ride his bike.  Recent Hospitalizations: None  Reviewed  CV studies:    The following studies were reviewed today: (if available, images/films reviewed: From Epic Chart or Care Everywhere) . None:   Interval History:   [name] is here today for routine follow-up stating he is doing fairly well overall from cardiac standpoint.  He is a little bit down because he is lost 5 friends to Covid and then a couple of the cancer in the last several months.  He is actually planning on heading to funeral later on today-a friend of his who had progressive cancer over the last several months.  He says his back and his hip pain been really bothering off and on has not been on to do as much walking as he would like to.  Still not able given the [facility] for fear of being exposed to Covid.  He does walk up and down the driveway several times a day but now limited more because of his  back and knee pain.  He has a lot of pedal peripheral neuropathy.  He understands that as a result of his reduced activity level he has become more sedentary and therefore deconditioned.  Denies any dyspnea, chest pain or pressure with rest or exertion.  No CHF symptoms or arrhythmia symptoms.  CV Review of Symptoms (Summary): no chest pain or dyspnea on exertion positive for - Shortness of breath with exertion that is more related to discomfort and not true dyspnea -> related to deconditioning negative for - chest pain, edema, irregular heartbeat, orthopnea, palpitations, paroxysmal nocturnal dyspnea, rapid heart rate, shortness of breath or Syncope/near syncope or TIA/almost fugax, claudication  The patient does not have symptoms concerning for COVID-19 infection (fever, chills, cough, or new shortness of breath).   REVIEWED OF SYSTEMS   Review of Systems  Constitutional: Negative for malaise/fatigue and weight loss.  HENT: Negative for congestion and nosebleeds.   Respiratory: Negative for shortness of breath.   Genitourinary: Positive for frequency (Frequent nocturia).  Musculoskeletal: Positive for back pain and joint pain (Hips and knees).  Neurological: Negative for dizziness, focal weakness and headaches.  Psychiatric/Behavioral: Negative.    I have reviewed and (if needed) personally updated the patient's problem list, medications, allergies, past medical and surgical history, social and family history.   PAST MEDICAL HISTORY  Past Medical History:  Diagnosis Date  . Arthritis   . BPH (benign prostatic hyperplasia)     followed by Dr. Risa Grill  . 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 CKD (chronic kidney disease) stage 3, GFR 30-59 ml/min     Dr. Arty Baumgartner  . Dyslipidemia    On fenofibrate and fish oil  . Hearing loss   . Hernia    from 2006 surgery   . History of kidney stones 15 years ago  . Hx of colonic polyps   . Hypertension, essential, benign   .  Hypothyroidism   . Pneumonia 12/14-1/15   04/15/13-continued cough, non productive, no fever  . Staph infection 4 years ago  . Tendonitis of elbow, right   . Tinnitus   . Type II diabetes mellitus, well controlled ([place])    with diet and exercise   Immunization History  Administered Date(s) Administered  . PFIZER SARS-COV-2 Vaccination 04/29/2019, 05/25/2019   PAST SURGICAL HISTORY   Past Surgical History:  Procedure Laterality Date  . [place]   lower back, "2 disc removed"  . BICEPS TENDON REPAIR Right 2004  . COLONOSCOPY W/ POLYPECTOMY  2012  . EYE SURGERY Left 1974   eye removed for eye cancer  . NM MYOCAR PERF WALL MOTION  09/13/2008   normal  . SHOULDER ARTHROSCOPY Left   . SMALL INTESTINE SURGERY  2006   "took everything out and fixed it"  . TONSILLECTOMY  1970's  . TOTAL KNEE ARTHROPLASTY Left 04/22/2013   Procedure: LEFT TOTAL KNEE ARTHROPLASTY;  Surgeon: Johnn Hai, MD;  Location: [facility];  Service: Orthopedics;  Laterality: Left;  . TOTAL KNEE ARTHROPLASTY Right 10/21/2013   Procedure: TOTAL RIGHT  KNEE ARTHROPLASTY;  Surgeon: Johnn Hai, MD;  Location: [facility];  Service: Orthopedics;  Laterality: Right;  . US ECHOCARDIOGRAPHY  09/13/2008   mild centrially directed MR, mild TR, boderline aortic root dilatation.  Marland Kitchen [place]    Immunization History  Administered Date(s) Administered  . PFIZER SARS-COV-2 Vaccination 04/29/2019, 05/25/2019    MEDICATIONS/ALLERGIES   Current Meds  Medication Sig  . ACCU-CHEK AVIVA PLUS test strip   . aspirin EC 81 MG tablet Take 81 mg by mouth daily.  . Blood Glucose Monitoring Suppl (ACCU-CHEK AVIVA PLUS) w/Device KIT   . doxazosin (CARDURA) 4 MG tablet Take 4 mg by mouth at bedtime.  Marland Kitchen FLUOCINOLONE ACETONIDE SCALP 0.01 % OIL Apply 1 application topically once a week.  . fluticasone (FLONASE) 50 MCG/ACT nasal spray Place 2 sprays into both nostrils daily.  Marland Kitchen glimepiride (AMARYL) 2 MG tablet   . Lancets  (ONETOUCH ULTRASOFT) lancets   . levothyroxine (SYNTHROID, LEVOTHROID) 100 MCG tablet Take 100 mcg by mouth daily before breakfast.  . losartan (COZAAR) 25 MG tablet TAKE 1 TABLET(25 MG) BY MOUTH DAILY  . oxybutynin (DITROPAN-XL) 5 MG 24 hr tablet Take 10 mg by mouth at bedtime.   . pioglitazone (ACTOS) 15 MG tablet Take 15 mg by mouth daily.  . pravastatin (PRAVACHOL) 10 MG tablet Take 1 tablet by mouth at bedtime.   . tamsulosin (FLOMAX) 0.4 MG CAPS capsule Take 1 capsule by mouth every morning.    Allergies  Allergen Reactions  . Lyrica [Pregabalin]     SOCIAL HISTORY/FAMILY HISTORY   Reviewed in Epic:  Pertinent findings:  Still not able go to the [facility] to exercise - concerns about COVID.  OBJCTIVE -PE, EKG, labs  Wt Readings from Last 3 Encounters:  11/05/19 221 lb 6.4 oz (100.4 kg)  11/04/18 221 lb (100.2 kg)  06/11/17 225 lb 3.2 oz (102.2 kg)    Physical Exam: BP 122/72   Pulse (!) 56   Ht 5' 10" (1.778 m)   Wt 221 lb 6.4 oz (100.4 kg)   SpO2 95%   BMI 31.77 kg/m  Physical Exam Constitutional:      General: He is not in acute distress.    Appearance: Normal appearance. He is obese. He is not ill-appearing.  HENT:     Head: Normocephalic and atraumatic.     Comments: Mild left eye ptosis-chronic Neck:     Vascular: No carotid bruit.     Comments: No JVD or bruit Cardiovascular:     Rate and Rhythm: Normal rate and regular rhythm.     Pulses: Normal pulses.     Heart sounds: No murmur heard.  No friction rub. No gallop.   Pulmonary:     Effort: Pulmonary effort is normal. No respiratory distress.     Breath sounds: Normal breath sounds.  Chest:     Chest wall: No tenderness.  Abdominal:     General: Bowel sounds are normal. There is no distension.     Palpations: Abdomen is soft. There is no mass (No HSM).  Musculoskeletal:        General: No swelling. Normal range of motion.     Cervical back: Normal range of motion.  Skin:    Comments: Bilateral LE  GSV & SSV dilated with mild Varicose Veins.   Neurological:     General: No focal deficit present.     Mental Status: He is alert and oriented to person, place, and time.  Psychiatric:        Mood and Affect: Mood normal.        Behavior: Behavior normal.        Thought Content: Thought content normal.        Judgment: Judgment normal.      Adult ECG Report n/a  Recent Labs:  PCP checks labs (not available) Lab Results  Component Value Date   CHOL 170 06/11/2017   HDL 27 (L) 06/11/2017   LDLCALC 65 06/11/2017   TRIG 391 (H) 06/11/2017   CHOLHDL 6.3 (H) 06/11/2017   Lab Results  Component Value Date   CREATININE 1.45 (H) 06/11/2017   BUN 20 06/11/2017   NA 139 06/11/2017   K 4.6 06/11/2017   CL 101 06/11/2017   CO2 23 06/11/2017   No results found for: TSH  ASSESSMENT/PLAN    Problem List Items Addressed This Visit    Essential hypertension - Primary (Chronic)    Blood pressure seems well controlled on current medications.  Is taking losartan alone-appropriate for diabetic.  No orthostatic symptoms.  No change      Dyslipidemia associated with type 2 diabetes mellitus (HCC) (Chronic)    As of last year, her lipids look well controlled.  LDL was 52.  Triglycerides elevated related to diabetes.  Remains on low-dose pravastatin and tolerating well.  Labs should be to be checked by PCP soon.      Relevant Medications   glimepiride (AMARYL) 2 MG tablet   pioglitazone (ACTOS) 15 MG tablet   Obesity (BMI 30-39.9) (Chronic)    Doing pretty good job keep his weight stable.  He was concerned with his decreased exercise level that he will be gaining weight.  I congratulated him on  staying active and watching his diet.      Relevant Medications   glimepiride (AMARYL) 2 MG tablet   pioglitazone (ACTOS) 15 MG tablet   Metabolic syndrome (Chronic)      COVID-19 Education: The signs and symptoms of COVID-19 were discussed with the patient and how to seek care for testing  (follow up with PCP or arrange E-visit).   The importance of social distancing and COVID-19 vaccination was discussed today. The patient is practicing social distancing & Masking.   I spent a total of 59mnutes with the patient spent in direct patient consultation.  Additional time spent with chart review  / charting (studies, outside notes, etc): 8 Total Time: 28 min  Current medicines are reviewed at length with the patient today.  (+/- concerns) n/a  Notice: This dictation was prepared with Dragon dictation along with smaller phrase technology. Any transcriptional errors that result from this process are unintentional and may not be corrected upon review.  Patient Instructions / Medication Changes & Studies & Tests Ordered   Patient Instructions  Medication Instructions:  Continue same medications *If you need a refill on your cardiac medications before your next appointment, please call your pharmacy*   Lab Work: None ordered   Testing/Procedures: None ordered   Follow-Up: [facility] you and your health needs are our priority.  As part of our continuing mission to provide you with exceptional heart care, we have created designated Provider Care Teams.  These Care Teams include your primary Cardiologist (physician) and Advanced Practice Providers (APPs -  Physician Assistants and Nurse Practitioners) who all work together to provide you with the care you need, when you need it.  We recommend signing up for the patient portal called "MyChart".  Sign up information is provided on this After Visit Summary.  MyChart is used to connect with patients for Virtual Visits (Telemedicine).  Patients are able to view lab/test results, encounter notes, upcoming appointments, etc.  Non-urgent messages can be sent to your provider as well.   To learn more about what you can do with MyChart, go to [email or web address]    Your next appointment: 1 year    Call in July to schedule Sept  appointment    The format for your next appointment: Office     Provider: Dr.Kateline Kinkade       Studies Ordered:   No orders of the defined types were placed in this encounter.    DGlenetta Hew M.D., M.S. Interventional Cardiologist   Pager # [phone] # [phone] [place] [id number]  Thank you for choosing [facility]!

## 2019-11-05 NOTE — Patient Instructions (Signed)
Medication Instructions:  Continue same medications *If you need a refill on your cardiac medications before your next appointment, please call your pharmacy*   Lab Work: None ordered   Testing/Procedures: None ordered   Follow-Up: At [facility], you and your health needs are our priority.  As part of our continuing mission to provide you with exceptional heart care, we have created designated Provider Care Teams.  These Care Teams include your primary Cardiologist (physician) and Advanced Practice Providers (APPs -  Physician Assistants and Nurse Practitioners) who all work together to provide you with the care you need, when you need it.  We recommend signing up for the patient portal called "MyChart".  Sign up information is provided on this After Visit Summary.  MyChart is used to connect with patients for Virtual Visits (Telemedicine).  Patients are able to view lab/test results, encounter notes, upcoming appointments, etc.  Non-urgent messages can be sent to your provider as well.   To learn more about what you can do with MyChart, go to [email or web address].    Your next appointment:  1 year    Call in July to schedule Sept appointment    The format for your next appointment: Office    Provider:  Dr.Harding 

## 2019-11-09 DIAGNOSIS — I10 Essential (primary) hypertension: Secondary | ICD-10-CM | POA: Diagnosis not present

## 2019-11-09 DIAGNOSIS — N400 Enlarged prostate without lower urinary tract symptoms: Secondary | ICD-10-CM | POA: Diagnosis not present

## 2019-11-09 DIAGNOSIS — N2581 Secondary hyperparathyroidism of renal origin: Secondary | ICD-10-CM | POA: Diagnosis not present

## 2019-11-09 DIAGNOSIS — E1142 Type 2 diabetes mellitus with diabetic polyneuropathy: Secondary | ICD-10-CM | POA: Diagnosis not present

## 2019-11-09 DIAGNOSIS — E039 Hypothyroidism, unspecified: Secondary | ICD-10-CM | POA: Diagnosis not present

## 2019-11-09 DIAGNOSIS — E782 Mixed hyperlipidemia: Secondary | ICD-10-CM | POA: Diagnosis not present

## 2019-11-09 DIAGNOSIS — M1990 Unspecified osteoarthritis, unspecified site: Secondary | ICD-10-CM | POA: Diagnosis not present

## 2019-11-09 DIAGNOSIS — D631 Anemia in chronic kidney disease: Secondary | ICD-10-CM | POA: Diagnosis not present

## 2019-11-09 DIAGNOSIS — N183 Chronic kidney disease, stage 3 unspecified: Secondary | ICD-10-CM | POA: Diagnosis not present

## 2019-11-14 ENCOUNTER — Encounter: Payer: Self-pay | Admitting: Cardiology

## 2019-11-14 NOTE — Assessment & Plan Note (Signed)
Blood pressure seems well controlled on current medications.  Is taking losartan alone-appropriate for diabetic.  No orthostatic symptoms.  No change

## 2019-11-14 NOTE — Assessment & Plan Note (Signed)
Doing pretty good job keep his weight stable.  He was concerned with his decreased exercise level that he will be gaining weight.  I congratulated him on staying active and watching his diet.

## 2019-11-14 NOTE — Assessment & Plan Note (Signed)
As of last year, her lipids look well controlled.  LDL was 52.  Triglycerides elevated related to diabetes.  Remains on low-dose pravastatin and tolerating well.  Labs should be to be checked by PCP soon.

## 2019-12-01 DIAGNOSIS — D631 Anemia in chronic kidney disease: Secondary | ICD-10-CM | POA: Diagnosis not present

## 2019-12-01 DIAGNOSIS — N183 Chronic kidney disease, stage 3 unspecified: Secondary | ICD-10-CM | POA: Diagnosis not present

## 2019-12-01 DIAGNOSIS — Z23 Encounter for immunization: Secondary | ICD-10-CM | POA: Diagnosis not present

## 2019-12-01 DIAGNOSIS — N179 Acute kidney failure, unspecified: Secondary | ICD-10-CM | POA: Diagnosis not present

## 2019-12-01 DIAGNOSIS — N2581 Secondary hyperparathyroidism of renal origin: Secondary | ICD-10-CM | POA: Diagnosis not present

## 2019-12-01 DIAGNOSIS — E669 Obesity, unspecified: Secondary | ICD-10-CM | POA: Diagnosis not present

## 2019-12-01 DIAGNOSIS — E1122 Type 2 diabetes mellitus with diabetic chronic kidney disease: Secondary | ICD-10-CM | POA: Diagnosis not present

## 2019-12-01 DIAGNOSIS — G629 Polyneuropathy, unspecified: Secondary | ICD-10-CM | POA: Diagnosis not present

## 2019-12-01 DIAGNOSIS — I129 Hypertensive chronic kidney disease with stage 1 through stage 4 chronic kidney disease, or unspecified chronic kidney disease: Secondary | ICD-10-CM | POA: Diagnosis not present

## 2020-01-05 DIAGNOSIS — E119 Type 2 diabetes mellitus without complications: Secondary | ICD-10-CM | POA: Diagnosis not present

## 2020-01-25 DIAGNOSIS — N183 Chronic kidney disease, stage 3 unspecified: Secondary | ICD-10-CM | POA: Diagnosis not present

## 2020-04-27 DIAGNOSIS — N183 Chronic kidney disease, stage 3 unspecified: Secondary | ICD-10-CM | POA: Diagnosis not present

## 2020-05-08 DIAGNOSIS — Z7984 Long term (current) use of oral hypoglycemic drugs: Secondary | ICD-10-CM | POA: Diagnosis not present

## 2020-05-08 DIAGNOSIS — N183 Chronic kidney disease, stage 3 unspecified: Secondary | ICD-10-CM | POA: Diagnosis not present

## 2020-05-08 DIAGNOSIS — D631 Anemia in chronic kidney disease: Secondary | ICD-10-CM | POA: Diagnosis not present

## 2020-05-08 DIAGNOSIS — N2581 Secondary hyperparathyroidism of renal origin: Secondary | ICD-10-CM | POA: Diagnosis not present

## 2020-05-08 DIAGNOSIS — E1142 Type 2 diabetes mellitus with diabetic polyneuropathy: Secondary | ICD-10-CM | POA: Diagnosis not present

## 2020-05-08 DIAGNOSIS — I10 Essential (primary) hypertension: Secondary | ICD-10-CM | POA: Diagnosis not present

## 2020-05-08 DIAGNOSIS — E039 Hypothyroidism, unspecified: Secondary | ICD-10-CM | POA: Diagnosis not present

## 2020-05-08 DIAGNOSIS — Z0000 Encounter for general adult medical examination without abnormal findings: Secondary | ICD-10-CM | POA: Diagnosis not present

## 2020-05-08 DIAGNOSIS — E782 Mixed hyperlipidemia: Secondary | ICD-10-CM | POA: Diagnosis not present

## 2020-05-16 DIAGNOSIS — N179 Acute kidney failure, unspecified: Secondary | ICD-10-CM | POA: Diagnosis not present

## 2020-05-16 DIAGNOSIS — N2581 Secondary hyperparathyroidism of renal origin: Secondary | ICD-10-CM | POA: Diagnosis not present

## 2020-05-16 DIAGNOSIS — E669 Obesity, unspecified: Secondary | ICD-10-CM | POA: Diagnosis not present

## 2020-05-16 DIAGNOSIS — D631 Anemia in chronic kidney disease: Secondary | ICD-10-CM | POA: Diagnosis not present

## 2020-05-16 DIAGNOSIS — I129 Hypertensive chronic kidney disease with stage 1 through stage 4 chronic kidney disease, or unspecified chronic kidney disease: Secondary | ICD-10-CM | POA: Diagnosis not present

## 2020-05-16 DIAGNOSIS — E1122 Type 2 diabetes mellitus with diabetic chronic kidney disease: Secondary | ICD-10-CM | POA: Diagnosis not present

## 2020-05-16 DIAGNOSIS — N183 Chronic kidney disease, stage 3 unspecified: Secondary | ICD-10-CM | POA: Diagnosis not present

## 2020-05-16 DIAGNOSIS — G629 Polyneuropathy, unspecified: Secondary | ICD-10-CM | POA: Diagnosis not present

## 2020-07-25 DIAGNOSIS — N183 Chronic kidney disease, stage 3 unspecified: Secondary | ICD-10-CM | POA: Diagnosis not present

## 2020-09-27 DIAGNOSIS — H903 Sensorineural hearing loss, bilateral: Secondary | ICD-10-CM | POA: Diagnosis not present

## 2020-10-24 DIAGNOSIS — N1831 Chronic kidney disease, stage 3a: Secondary | ICD-10-CM | POA: Diagnosis not present

## 2020-10-24 DIAGNOSIS — N189 Chronic kidney disease, unspecified: Secondary | ICD-10-CM | POA: Diagnosis not present

## 2020-10-30 DIAGNOSIS — U071 COVID-19: Secondary | ICD-10-CM | POA: Diagnosis not present

## 2020-11-08 DIAGNOSIS — N179 Acute kidney failure, unspecified: Secondary | ICD-10-CM | POA: Diagnosis not present

## 2020-11-08 DIAGNOSIS — G629 Polyneuropathy, unspecified: Secondary | ICD-10-CM | POA: Diagnosis not present

## 2020-11-08 DIAGNOSIS — N2581 Secondary hyperparathyroidism of renal origin: Secondary | ICD-10-CM | POA: Diagnosis not present

## 2020-11-08 DIAGNOSIS — E1122 Type 2 diabetes mellitus with diabetic chronic kidney disease: Secondary | ICD-10-CM | POA: Diagnosis not present

## 2020-11-08 DIAGNOSIS — D631 Anemia in chronic kidney disease: Secondary | ICD-10-CM | POA: Diagnosis not present

## 2020-11-08 DIAGNOSIS — E669 Obesity, unspecified: Secondary | ICD-10-CM | POA: Diagnosis not present

## 2020-11-08 DIAGNOSIS — I129 Hypertensive chronic kidney disease with stage 1 through stage 4 chronic kidney disease, or unspecified chronic kidney disease: Secondary | ICD-10-CM | POA: Diagnosis not present

## 2020-11-08 DIAGNOSIS — N183 Chronic kidney disease, stage 3 unspecified: Secondary | ICD-10-CM | POA: Diagnosis not present

## 2020-11-10 DIAGNOSIS — E1142 Type 2 diabetes mellitus with diabetic polyneuropathy: Secondary | ICD-10-CM | POA: Diagnosis not present

## 2020-11-10 DIAGNOSIS — N183 Chronic kidney disease, stage 3 unspecified: Secondary | ICD-10-CM | POA: Diagnosis not present

## 2020-11-10 DIAGNOSIS — Z23 Encounter for immunization: Secondary | ICD-10-CM | POA: Diagnosis not present

## 2020-11-10 DIAGNOSIS — I10 Essential (primary) hypertension: Secondary | ICD-10-CM | POA: Diagnosis not present

## 2020-11-10 DIAGNOSIS — E782 Mixed hyperlipidemia: Secondary | ICD-10-CM | POA: Diagnosis not present

## 2020-11-10 DIAGNOSIS — Z7984 Long term (current) use of oral hypoglycemic drugs: Secondary | ICD-10-CM | POA: Diagnosis not present

## 2020-11-10 DIAGNOSIS — D631 Anemia in chronic kidney disease: Secondary | ICD-10-CM | POA: Diagnosis not present

## 2020-11-10 DIAGNOSIS — E039 Hypothyroidism, unspecified: Secondary | ICD-10-CM | POA: Diagnosis not present

## 2020-11-10 DIAGNOSIS — N2581 Secondary hyperparathyroidism of renal origin: Secondary | ICD-10-CM | POA: Diagnosis not present

## 2020-11-10 DIAGNOSIS — N400 Enlarged prostate without lower urinary tract symptoms: Secondary | ICD-10-CM | POA: Diagnosis not present

## 2020-11-20 DIAGNOSIS — M545 Low back pain, unspecified: Secondary | ICD-10-CM | POA: Diagnosis not present

## 2020-11-20 DIAGNOSIS — M25561 Pain in right knee: Secondary | ICD-10-CM | POA: Diagnosis not present

## 2020-12-12 DIAGNOSIS — Z4421 Encounter for fitting and adjustment of artificial right eye: Secondary | ICD-10-CM | POA: Diagnosis not present

## 2021-01-08 ENCOUNTER — Ambulatory Visit: Payer: Medicare PPO | Admitting: Cardiology

## 2021-01-08 ENCOUNTER — Other Ambulatory Visit: Payer: Self-pay

## 2021-01-08 ENCOUNTER — Encounter: Payer: Self-pay | Admitting: Cardiology

## 2021-01-08 VITALS — BP 134/78 | HR 51 | Ht 70.0 in | Wt 231.8 lb

## 2021-01-08 DIAGNOSIS — E1169 Type 2 diabetes mellitus with other specified complication: Secondary | ICD-10-CM

## 2021-01-08 DIAGNOSIS — I10 Essential (primary) hypertension: Secondary | ICD-10-CM

## 2021-01-08 DIAGNOSIS — E8881 Metabolic syndrome: Secondary | ICD-10-CM

## 2021-01-08 DIAGNOSIS — E785 Hyperlipidemia, unspecified: Secondary | ICD-10-CM | POA: Diagnosis not present

## 2021-01-08 DIAGNOSIS — E119 Type 2 diabetes mellitus without complications: Secondary | ICD-10-CM | POA: Diagnosis not present

## 2021-01-08 DIAGNOSIS — E669 Obesity, unspecified: Secondary | ICD-10-CM

## 2021-01-08 NOTE — Progress Notes (Signed)
Primary Care Provider: Orpah Melter, MD Cardiologist: None Electrophysiologist: None  Clinic Note: Chief Complaint  Patient presents with   Follow-up    Annual.  No complaints    ===================================  ASSESSMENT/PLAN   Problem List Items Addressed This Visit       Cardiology Problems   Essential hypertension - Primary (Chronic)    Borderline blood pressure today.  Usually better controlled.  He is on low-dose losartan alone-appropriate in the setting of DM2.  We do have room to titrate further if pressures increase.      Relevant Orders   EKG 12-Lead (Completed)     Other   Dyslipidemia associated with type 2 diabetes mellitus (HCC) (Chronic)    LDL not as good as it had been in the past.  Now up to 70 from 52 in the past.  Probably related to weight gain.  Remains on pravastatin.  If his lipids continue to increase, would probably consider increasing the dose to 20 or 40 mg versus switch to a different statin.      Relevant Orders   EKG 12-Lead (Completed)   Obesity (BMI 30-39.9) (Chronic)   Type II diabetes mellitus, well controlled (HCC) (Chronic)    On Amaryl and Actos. ->  Defer to PCP, but would consider something Actos to avoid potential cardiovascular side effects..      Metabolic syndrome (Chronic)    Hypertension, obesity, hyperlipidemia with hypertriglyceridemia and diabetes.  Important control of all risk factors.  He is on statin with relatively decent lipid control, blood pressure well controlled on ARB although there is potential for him to increase.  Glycemic control borderline with A1c of 6.9.  Unfortunately, he has gained weight as opposed to losing weight.  This is probably because of deconditioning and back pain.  Needs to try to monitor his diet and try to get some walking even if it is stationary bicycle or pedal bike.       ===================================  HPI:    [name] is a 80 y.o. male with a PMH notable  for METABOLIC SYNROME (HTN, HLD-TG, DM-2 & OBESITY) below who presents today for annual f/u.  [name] was last seen on 11/05/19--> doing very well from a cardiac standpoint.  Sad because of losing ~7 friends due to combination of COVID and cancer.  Was actually on his way to a funeral for a friend on the day of his visit.  Mostly limited by back and hip pain.  Not able to walk as much as he wanted to.  Was very upset about not being to go back to the [facility] for fear of COVID exposure.  Mostly walking up and down his driveway several times a day.  Significant pedal/peripheral neuropathy.  Noted deconditioning.  Only some exertional dyspnea related to back pain and deconditioning.   CV Review of Symptoms (Summary): no chest pain or dyspnea on exertion positive for - Shortness of breath with exertion that is more related to discomfort and not true dyspnea -> related to deconditioning negative for - chest pain, edema, irregular heartbeat, orthopnea, palpitations, paroxysmal nocturnal dyspnea, rapid heart rate, shortness of breath or Syncope/near syncope or TIA/almost fugax, claudication  Recent Hospitalizations: None  Reviewed  CV studies:    The following studies were reviewed today: (if available, images/films reviewed: From Epic Chart or Care Everywhere) None:   Interval History:   [name] returns today for annual follow-up overall doing well.  He is really bothered by  the fact that he is now having progressive hearing loss.  His hearing aids are just not working well.  Quite upset about that.  Remains sedentary because of chronic back pain.  Has gained quite a bit of weight. Concerned about using NSAIDS for chronic back pain due to concern re: affect on renal function.  BP has been up a bit - but mostly associated with when his back is bothering him.    Other than occasional palpitations here and there that are very rare.  He stable.  No rest or exertional dyspnea or chest pain.  Now PND,  orthopnea with trivial edema.  He does not sleep well because of back pain and nocturia.  As such, he does have some fatigue.  CV Review of Symptoms (Summary) Cardiovascular ROS: positive for - palpitations and mild exertional dyspnea/fatigue related to deconditioning-related to back pain.. negative for - chest pain, edema, orthopnea, paroxysmal nocturnal dyspnea, rapid heart rate, shortness of breath, or syncope/near syncope, TIA/amaurosis fugax, claudication.  REVIEWED OF SYSTEMS   Review of Systems  Constitutional:  Positive for malaise/fatigue (More deconditioning, lack of activity because of back pain.  Also poor sleep.). Negative for weight loss (Weight gain).  HENT:  Negative for congestion and sinus pain.   Respiratory:  Negative for cough and shortness of breath.   Gastrointestinal:  Negative for blood in stool, constipation and melena.  Genitourinary:  Positive for frequency (4-5 times/night nocturia.).  Musculoskeletal:  Positive for back pain and joint pain (Also hips and knees, but more so his back).  Neurological:  Positive for tingling (Radicular pain from back as well as pedal neuropathy.). Negative for seizures and weakness.  Psychiatric/Behavioral:  Negative for depression and memory loss. The patient is not nervous/anxious and does not have insomnia (Poor sleep related to nocturia.).   Genitourinary: Positive for frequency (Frequent nocturia).   I have reviewed and (if needed) personally updated the patient's problem list, medications, allergies, past medical and surgical history, social and family history.   PAST MEDICAL HISTORY   Past Medical History:  Diagnosis Date   Arthritis    BPH (benign prostatic hyperplasia)     followed by Dr. 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 Arty Baumgartner   Dyslipidemia    On fenofibrate and fish oil   Hearing loss    Hernia    from 2006 surgery     History of kidney stones 15 years ago   Hx of colonic polyps    Hypertension, essential, benign    Hypothyroidism    Pneumonia 12/14-1/15   04/15/13-continued cough, non productive, no fever   Staph infection 4 years ago   Tendonitis of elbow, right    Tinnitus    Type II diabetes mellitus, well controlled ([place])    with diet and exercise    PAST SURGICAL HISTORY   Past Surgical History:  Procedure Laterality Date   [place]   lower back, "2 disc removed"   BICEPS TENDON REPAIR Right 2004   COLONOSCOPY W/ POLYPECTOMY  2012   EYE SURGERY Left 1974   eye removed for eye cancer   NM MYOCAR PERF WALL MOTION  09/13/2008   normal   SHOULDER ARTHROSCOPY Left    SMALL INTESTINE SURGERY  2006   "took everything out and fixed it"   TONSILLECTOMY  1970's   TOTAL KNEE  ARTHROPLASTY Left 04/22/2013   Procedure: LEFT TOTAL KNEE ARTHROPLASTY;  Surgeon: Johnn Hai, MD;  Location: [facility];  Service: Orthopedics;  Laterality: Left;   TOTAL KNEE ARTHROPLASTY Right 10/21/2013   Procedure: TOTAL RIGHT  KNEE ARTHROPLASTY;  Surgeon: Johnn Hai, MD;  Location: [facility];  Service: Orthopedics;  Laterality: Right;   US ECHOCARDIOGRAPHY  09/13/2008   mild centrially directed MR, mild TR, boderline aortic root dilatation.   VASECTOMY  1975    Immunization History  Administered Date(s) Administered   PFIZER(Purple Top)SARS-COV-2 Vaccination 04/29/2019, 05/25/2019    MEDICATIONS/ALLERGIES   Current Meds  Medication Sig   ACCU-CHEK AVIVA PLUS test strip    benzonatate (TESSALON) 100 MG capsule benzonatate 100 mg capsule  TAKE 1 CAPSULE BY MOUTH THREE TIMES DAILY FOR 10 DAYS AS NEEDED   Blood Glucose Monitoring Suppl (ACCU-CHEK AVIVA PLUS) w/Device KIT    doxazosin (CARDURA) 4 MG tablet Take 4 mg by mouth at bedtime.   FLUOCINOLONE ACETONIDE SCALP 0.01 % OIL Apply 1 application topically once a week.   fluticasone (FLONASE) 50 MCG/ACT nasal spray Place 2 sprays into both nostrils  daily.   glimepiride (AMARYL) 2 MG tablet    Lancets (ONETOUCH ULTRASOFT) lancets    levothyroxine (SYNTHROID, LEVOTHROID) 100 MCG tablet Take 100 mcg by mouth daily before breakfast.   losartan (COZAAR) 25 MG tablet TAKE 1 TABLET(25 MG) BY MOUTH DAILY   Omega-3 Fatty Acids (FISH OIL) 1000 MG CAPS omega-3 acid ethyl esters 1 gram capsule   oxybutynin (DITROPAN-XL) 5 MG 24 hr tablet Take 10 mg by mouth at bedtime.    pioglitazone (ACTOS) 15 MG tablet Take 15 mg by mouth daily.   pravastatin (PRAVACHOL) 10 MG tablet Take 1 tablet by mouth at bedtime.    tamsulosin (FLOMAX) 0.4 MG CAPS capsule Take 1 capsule by mouth every morning.   [DISCONTINUED] aspirin EC 81 MG tablet Take 81 mg by mouth daily.    Allergies  Allergen Reactions   Lyrica [Pregabalin]     SOCIAL HISTORY/FAMILY HISTORY   Reviewed in Epic:  Pertinent findings:  Social History   Tobacco Use   Smoking status: Never   Smokeless tobacco: Never  Substance Use Topics   Alcohol use: No   Drug use: No   Social History   Social History Narrative   He is a widowed father of 87, grandfather and 65 with 2 great grandchildren.  Up until the last few weeks when his knee is really been hurting him, his attorney regular exercise.  He was a member of [employer], and so I am routinely.   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  He enjoys traveling.   He does not smoke, and does not drink alcohol.    OBJCTIVE -PE, EKG, labs   Wt Readings from Last 3 Encounters:  01/08/21 231 lb 12.8 oz (105.1 kg)  11/05/19 221 lb 6.4 oz (100.4 kg)  11/04/18 221 lb (100.2 kg)    Physical Exam: BP 134/78   Pulse (!) 51   Ht 5' 10"  (1.778 m)   Wt 231 lb 12.8 oz (105.1 kg)   SpO2 95%   BMI 33.26 kg/m  Physical Exam Constitutional:      Appearance: Normal appearance. He is obese.  HENT:     Head: Normocephalic and atraumatic.  Eyes:     Extraocular Movements: Extraocular movements intact.      Pupils: Pupils are equal,  round, and reactive to light.     Comments: Left eye ptosis-chronic  Neck:     Vascular: No carotid bruit or JVD.  Cardiovascular:     Rate and Rhythm: Regular rhythm. Bradycardia present. No extrasystoles are present.    Chest Wall: PMI is not displaced.     Pulses: Normal pulses.     Heart sounds: S1 normal and S2 normal. No murmur heard.   No friction rub. No gallop.  Pulmonary:     Effort: Pulmonary effort is normal. No respiratory distress.     Breath sounds: Normal breath sounds.  Chest:     Chest wall: No tenderness.  Musculoskeletal:        General: No swelling (Trivial). Normal range of motion.     Cervical back: Normal range of motion and neck supple.  Skin:    General: Skin is warm and dry.     Comments: Bilateral LE GSV & SSV dilated with mild Varicose Veins.   Neurological:     General: No focal deficit present.     Mental Status: He is alert and oriented to person, place, and time.     Gait: Gait abnormal.  Psychiatric:        Mood and Affect: Mood normal.        Behavior: Behavior normal.        Thought Content: Thought content normal.        Judgment: Judgment normal.       Adult ECG Report  Rate: 51 ;  Rhythm: sinus bradycardia and normal axis, intervals durations. ;   Narrative Interpretation: Stable  Recent Labs:   3/72022: TC 155, TG 336, HDL 30, LDL 71. 11/11/2018: A1c 6.9.;  TSH 1.52 07/25/2020: Cr 1.60, K+ 4.2.  ==================================================  COVID-19 Education: The signs and symptoms of COVID-19 were discussed with the patient and how to seek care for testing (follow up with PCP or arrange E-visit).    I spent a total of 21 min with the patient spent in direct patient consultation.  Additional time spent with chart review  / charting (studies, outside notes, etc): 16 min Total Time: 37 min  Current medicines are reviewed at length with the patient today.  (+/- concerns) n/.a  This visit occurred  during the SARS-CoV-2 public health emergency.  Safety protocols were in place, including screening questions prior to the visit, additional usage of staff PPE, and extensive cleaning of exam room while observing appropriate contact time as indicated for disinfecting solutions.  Notice: This dictation was prepared with Dragon dictation along with smart phrase technology. Any transcriptional errors that result from this process are unintentional and may not be corrected upon review.  Patient Instructions / Medication Changes & Studies & Tests Ordered   Patient Instructions  Medication Instructions:   No changes  *If you need a refill on your cardiac medications before your next appointment, please call your pharmacy*   Lab Work:  Not needed   Testing/Procedures:  Not needed  Follow-Up: At [facility], you and your health needs are our priority.  As part of our continuing mission to provide you with exceptional heart care, we have created designated Provider Care Teams.  These Care Teams include your primary Cardiologist (physician) and Advanced Practice Providers (APPs -  Physician Assistants and Nurse Practitioners) who all work together to provide you with the care you need, when you need it.     Your next appointment:   12 month(s)  The format for  your next appointment:   In Person  Provider:   Dr Glenetta Hew      Studies Ordered:   Orders Placed This Encounter  Procedures   EKG 12-Lead      Glenetta Hew, M.D., M.S. Interventional Cardiologist   Pager # [phone] Phone # [phone] [address] [place], [place] [address]   Thank you for choosing Heartcare at [facility]!!

## 2021-01-08 NOTE — Patient Instructions (Signed)
Medication Instructions:   No changes  *If you need a refill on your cardiac medications before your next appointment, please call your pharmacy*   Lab Work:  Not needed   Testing/Procedures:  Not needed  Follow-Up: At [facility], you and your health needs are our priority.  As part of our continuing mission to provide you with exceptional heart care, we have created designated Provider Care Teams.  These Care Teams include your primary Cardiologist (physician) and Advanced Practice Providers (APPs -  Physician Assistants and Nurse Practitioners) who all work together to provide you with the care you need, when you need it.     Your next appointment:   12 month(s)  The format for your next appointment:   In Person  Provider:   Dr Glenetta Hew

## 2021-01-10 DIAGNOSIS — E119 Type 2 diabetes mellitus without complications: Secondary | ICD-10-CM | POA: Diagnosis not present

## 2021-01-10 DIAGNOSIS — Z0100 Encounter for examination of eyes and vision without abnormal findings: Secondary | ICD-10-CM | POA: Diagnosis not present

## 2021-01-13 ENCOUNTER — Encounter: Payer: Self-pay | Admitting: Cardiology

## 2021-01-13 NOTE — Assessment & Plan Note (Addendum)
Borderline blood pressure today.  Usually better controlled.  He is on low-dose losartan alone-appropriate in the setting of DM2.  We do have room to titrate further if pressures increase.

## 2021-01-13 NOTE — Assessment & Plan Note (Signed)
On Amaryl and Actos. ->  Defer to PCP, but would consider something Actos to avoid potential cardiovascular side effects.Marland Kitchen

## 2021-01-13 NOTE — Assessment & Plan Note (Signed)
Hypertension, obesity, hyperlipidemia with hypertriglyceridemia and diabetes.  Important control of all risk factors.  He is on statin with relatively decent lipid control, blood pressure well controlled on ARB although there is potential for him to increase.  Glycemic control borderline with A1c of 6.9.  Unfortunately, he has gained weight as opposed to losing weight.  This is probably because of deconditioning and back pain.  Needs to try to monitor his diet and try to get some walking even if it is stationary bicycle or pedal bike.

## 2021-01-13 NOTE — Assessment & Plan Note (Signed)
LDL not as good as it had been in the past.  Now up to 70 from 52 in the past.  Probably related to weight gain.  Remains on pravastatin.  If his lipids continue to increase, would probably consider increasing the dose to 20 or 40 mg versus switch to a different statin.

## 2021-01-24 DIAGNOSIS — N189 Chronic kidney disease, unspecified: Secondary | ICD-10-CM | POA: Diagnosis not present

## 2021-01-24 DIAGNOSIS — N1831 Chronic kidney disease, stage 3a: Secondary | ICD-10-CM | POA: Diagnosis not present

## 2021-04-27 DIAGNOSIS — N1831 Chronic kidney disease, stage 3a: Secondary | ICD-10-CM | POA: Diagnosis not present

## 2021-04-27 DIAGNOSIS — N189 Chronic kidney disease, unspecified: Secondary | ICD-10-CM | POA: Diagnosis not present

## 2021-04-30 DIAGNOSIS — E039 Hypothyroidism, unspecified: Secondary | ICD-10-CM | POA: Diagnosis not present

## 2021-04-30 DIAGNOSIS — E782 Mixed hyperlipidemia: Secondary | ICD-10-CM | POA: Diagnosis not present

## 2021-04-30 DIAGNOSIS — Z7984 Long term (current) use of oral hypoglycemic drugs: Secondary | ICD-10-CM | POA: Diagnosis not present

## 2021-04-30 DIAGNOSIS — D631 Anemia in chronic kidney disease: Secondary | ICD-10-CM | POA: Diagnosis not present

## 2021-04-30 DIAGNOSIS — E1142 Type 2 diabetes mellitus with diabetic polyneuropathy: Secondary | ICD-10-CM | POA: Diagnosis not present

## 2021-04-30 DIAGNOSIS — N2581 Secondary hyperparathyroidism of renal origin: Secondary | ICD-10-CM | POA: Diagnosis not present

## 2021-04-30 DIAGNOSIS — N400 Enlarged prostate without lower urinary tract symptoms: Secondary | ICD-10-CM | POA: Diagnosis not present

## 2021-04-30 DIAGNOSIS — N183 Chronic kidney disease, stage 3 unspecified: Secondary | ICD-10-CM | POA: Diagnosis not present

## 2021-04-30 DIAGNOSIS — G4700 Insomnia, unspecified: Secondary | ICD-10-CM | POA: Diagnosis not present

## 2021-04-30 DIAGNOSIS — I10 Essential (primary) hypertension: Secondary | ICD-10-CM | POA: Diagnosis not present

## 2021-06-04 DIAGNOSIS — E669 Obesity, unspecified: Secondary | ICD-10-CM | POA: Diagnosis not present

## 2021-06-04 DIAGNOSIS — N179 Acute kidney failure, unspecified: Secondary | ICD-10-CM | POA: Diagnosis not present

## 2021-06-04 DIAGNOSIS — N183 Chronic kidney disease, stage 3 unspecified: Secondary | ICD-10-CM | POA: Diagnosis not present

## 2021-06-04 DIAGNOSIS — I129 Hypertensive chronic kidney disease with stage 1 through stage 4 chronic kidney disease, or unspecified chronic kidney disease: Secondary | ICD-10-CM | POA: Diagnosis not present

## 2021-06-04 DIAGNOSIS — E1122 Type 2 diabetes mellitus with diabetic chronic kidney disease: Secondary | ICD-10-CM | POA: Diagnosis not present

## 2021-06-04 DIAGNOSIS — G629 Polyneuropathy, unspecified: Secondary | ICD-10-CM | POA: Diagnosis not present

## 2021-06-04 DIAGNOSIS — D631 Anemia in chronic kidney disease: Secondary | ICD-10-CM | POA: Diagnosis not present

## 2021-06-04 DIAGNOSIS — N2581 Secondary hyperparathyroidism of renal origin: Secondary | ICD-10-CM | POA: Diagnosis not present

## 2021-06-29 DIAGNOSIS — Z4421 Encounter for fitting and adjustment of artificial right eye: Secondary | ICD-10-CM | POA: Diagnosis not present

## 2021-07-18 DIAGNOSIS — N1831 Chronic kidney disease, stage 3a: Secondary | ICD-10-CM | POA: Diagnosis not present

## 2021-10-26 DIAGNOSIS — N183 Chronic kidney disease, stage 3 unspecified: Secondary | ICD-10-CM | POA: Diagnosis not present

## 2021-10-29 DIAGNOSIS — D631 Anemia in chronic kidney disease: Secondary | ICD-10-CM | POA: Diagnosis not present

## 2021-10-29 DIAGNOSIS — E1142 Type 2 diabetes mellitus with diabetic polyneuropathy: Secondary | ICD-10-CM | POA: Diagnosis not present

## 2021-10-29 DIAGNOSIS — N400 Enlarged prostate without lower urinary tract symptoms: Secondary | ICD-10-CM | POA: Diagnosis not present

## 2021-10-29 DIAGNOSIS — N2581 Secondary hyperparathyroidism of renal origin: Secondary | ICD-10-CM | POA: Diagnosis not present

## 2021-10-29 DIAGNOSIS — E782 Mixed hyperlipidemia: Secondary | ICD-10-CM | POA: Diagnosis not present

## 2021-10-29 DIAGNOSIS — G4700 Insomnia, unspecified: Secondary | ICD-10-CM | POA: Diagnosis not present

## 2021-10-29 DIAGNOSIS — I10 Essential (primary) hypertension: Secondary | ICD-10-CM | POA: Diagnosis not present

## 2021-10-29 DIAGNOSIS — E039 Hypothyroidism, unspecified: Secondary | ICD-10-CM | POA: Diagnosis not present

## 2021-10-29 DIAGNOSIS — N1831 Chronic kidney disease, stage 3a: Secondary | ICD-10-CM | POA: Diagnosis not present

## 2021-11-09 DIAGNOSIS — H6123 Impacted cerumen, bilateral: Secondary | ICD-10-CM | POA: Diagnosis not present

## 2021-11-26 DIAGNOSIS — N1831 Chronic kidney disease, stage 3a: Secondary | ICD-10-CM | POA: Diagnosis not present

## 2021-11-26 DIAGNOSIS — E1142 Type 2 diabetes mellitus with diabetic polyneuropathy: Secondary | ICD-10-CM | POA: Diagnosis not present

## 2021-11-26 DIAGNOSIS — N2581 Secondary hyperparathyroidism of renal origin: Secondary | ICD-10-CM | POA: Diagnosis not present

## 2021-11-26 DIAGNOSIS — I10 Essential (primary) hypertension: Secondary | ICD-10-CM | POA: Diagnosis not present

## 2021-11-26 DIAGNOSIS — E039 Hypothyroidism, unspecified: Secondary | ICD-10-CM | POA: Diagnosis not present

## 2021-11-26 DIAGNOSIS — R351 Nocturia: Secondary | ICD-10-CM | POA: Diagnosis not present

## 2021-11-26 DIAGNOSIS — E782 Mixed hyperlipidemia: Secondary | ICD-10-CM | POA: Diagnosis not present

## 2021-11-26 DIAGNOSIS — D631 Anemia in chronic kidney disease: Secondary | ICD-10-CM | POA: Diagnosis not present

## 2021-11-26 DIAGNOSIS — Z0000 Encounter for general adult medical examination without abnormal findings: Secondary | ICD-10-CM | POA: Diagnosis not present

## 2021-12-18 DIAGNOSIS — C44319 Basal cell carcinoma of skin of other parts of face: Secondary | ICD-10-CM | POA: Diagnosis not present

## 2021-12-18 DIAGNOSIS — Z85828 Personal history of other malignant neoplasm of skin: Secondary | ICD-10-CM | POA: Diagnosis not present

## 2021-12-18 DIAGNOSIS — Z85820 Personal history of malignant melanoma of skin: Secondary | ICD-10-CM | POA: Diagnosis not present

## 2021-12-18 DIAGNOSIS — L821 Other seborrheic keratosis: Secondary | ICD-10-CM | POA: Diagnosis not present

## 2021-12-18 DIAGNOSIS — D485 Neoplasm of uncertain behavior of skin: Secondary | ICD-10-CM | POA: Diagnosis not present

## 2021-12-26 DIAGNOSIS — Z4421 Encounter for fitting and adjustment of artificial right eye: Secondary | ICD-10-CM | POA: Diagnosis not present

## 2021-12-27 DIAGNOSIS — D631 Anemia in chronic kidney disease: Secondary | ICD-10-CM | POA: Diagnosis not present

## 2021-12-27 DIAGNOSIS — I129 Hypertensive chronic kidney disease with stage 1 through stage 4 chronic kidney disease, or unspecified chronic kidney disease: Secondary | ICD-10-CM | POA: Diagnosis not present

## 2021-12-27 DIAGNOSIS — N2581 Secondary hyperparathyroidism of renal origin: Secondary | ICD-10-CM | POA: Diagnosis not present

## 2021-12-27 DIAGNOSIS — N179 Acute kidney failure, unspecified: Secondary | ICD-10-CM | POA: Diagnosis not present

## 2021-12-27 DIAGNOSIS — G629 Polyneuropathy, unspecified: Secondary | ICD-10-CM | POA: Diagnosis not present

## 2021-12-27 DIAGNOSIS — E1122 Type 2 diabetes mellitus with diabetic chronic kidney disease: Secondary | ICD-10-CM | POA: Diagnosis not present

## 2021-12-27 DIAGNOSIS — N183 Chronic kidney disease, stage 3 unspecified: Secondary | ICD-10-CM | POA: Diagnosis not present

## 2021-12-27 DIAGNOSIS — E669 Obesity, unspecified: Secondary | ICD-10-CM | POA: Diagnosis not present

## 2022-02-01 DIAGNOSIS — N183 Chronic kidney disease, stage 3 unspecified: Secondary | ICD-10-CM | POA: Diagnosis not present

## 2022-02-05 DIAGNOSIS — Z85828 Personal history of other malignant neoplasm of skin: Secondary | ICD-10-CM | POA: Diagnosis not present

## 2022-02-05 DIAGNOSIS — C44319 Basal cell carcinoma of skin of other parts of face: Secondary | ICD-10-CM | POA: Diagnosis not present

## 2022-02-05 DIAGNOSIS — Z85820 Personal history of malignant melanoma of skin: Secondary | ICD-10-CM | POA: Diagnosis not present

## 2022-03-07 DIAGNOSIS — E119 Type 2 diabetes mellitus without complications: Secondary | ICD-10-CM | POA: Diagnosis not present

## 2022-03-07 DIAGNOSIS — Z0100 Encounter for examination of eyes and vision without abnormal findings: Secondary | ICD-10-CM | POA: Diagnosis not present

## 2022-03-11 DIAGNOSIS — N401 Enlarged prostate with lower urinary tract symptoms: Secondary | ICD-10-CM | POA: Diagnosis not present

## 2022-03-11 DIAGNOSIS — R351 Nocturia: Secondary | ICD-10-CM | POA: Diagnosis not present

## 2022-04-22 DIAGNOSIS — N401 Enlarged prostate with lower urinary tract symptoms: Secondary | ICD-10-CM | POA: Diagnosis not present

## 2022-04-22 DIAGNOSIS — R351 Nocturia: Secondary | ICD-10-CM | POA: Diagnosis not present

## 2022-04-29 DIAGNOSIS — E039 Hypothyroidism, unspecified: Secondary | ICD-10-CM | POA: Diagnosis not present

## 2022-04-29 DIAGNOSIS — E1122 Type 2 diabetes mellitus with diabetic chronic kidney disease: Secondary | ICD-10-CM | POA: Diagnosis not present

## 2022-04-29 DIAGNOSIS — N400 Enlarged prostate without lower urinary tract symptoms: Secondary | ICD-10-CM | POA: Diagnosis not present

## 2022-04-29 DIAGNOSIS — E782 Mixed hyperlipidemia: Secondary | ICD-10-CM | POA: Diagnosis not present

## 2022-04-29 DIAGNOSIS — N2581 Secondary hyperparathyroidism of renal origin: Secondary | ICD-10-CM | POA: Diagnosis not present

## 2022-04-29 DIAGNOSIS — E1142 Type 2 diabetes mellitus with diabetic polyneuropathy: Secondary | ICD-10-CM | POA: Diagnosis not present

## 2022-04-29 DIAGNOSIS — I10 Essential (primary) hypertension: Secondary | ICD-10-CM | POA: Diagnosis not present

## 2022-04-29 DIAGNOSIS — N1831 Chronic kidney disease, stage 3a: Secondary | ICD-10-CM | POA: Diagnosis not present

## 2022-04-29 DIAGNOSIS — D631 Anemia in chronic kidney disease: Secondary | ICD-10-CM | POA: Diagnosis not present

## 2022-05-07 DIAGNOSIS — N183 Chronic kidney disease, stage 3 unspecified: Secondary | ICD-10-CM | POA: Diagnosis not present

## 2022-05-07 DIAGNOSIS — N189 Chronic kidney disease, unspecified: Secondary | ICD-10-CM | POA: Diagnosis not present

## 2022-05-23 DIAGNOSIS — N401 Enlarged prostate with lower urinary tract symptoms: Secondary | ICD-10-CM | POA: Diagnosis not present

## 2022-05-23 DIAGNOSIS — R351 Nocturia: Secondary | ICD-10-CM | POA: Diagnosis not present

## 2022-06-25 DIAGNOSIS — Z4422 Encounter for fitting and adjustment of artificial left eye: Secondary | ICD-10-CM | POA: Diagnosis not present

## 2022-06-26 DIAGNOSIS — G629 Polyneuropathy, unspecified: Secondary | ICD-10-CM | POA: Diagnosis not present

## 2022-06-26 DIAGNOSIS — N179 Acute kidney failure, unspecified: Secondary | ICD-10-CM | POA: Diagnosis not present

## 2022-06-26 DIAGNOSIS — N183 Chronic kidney disease, stage 3 unspecified: Secondary | ICD-10-CM | POA: Diagnosis not present

## 2022-06-26 DIAGNOSIS — E669 Obesity, unspecified: Secondary | ICD-10-CM | POA: Diagnosis not present

## 2022-06-26 DIAGNOSIS — E1122 Type 2 diabetes mellitus with diabetic chronic kidney disease: Secondary | ICD-10-CM | POA: Diagnosis not present

## 2022-06-26 DIAGNOSIS — N2581 Secondary hyperparathyroidism of renal origin: Secondary | ICD-10-CM | POA: Diagnosis not present

## 2022-06-26 DIAGNOSIS — I129 Hypertensive chronic kidney disease with stage 1 through stage 4 chronic kidney disease, or unspecified chronic kidney disease: Secondary | ICD-10-CM | POA: Diagnosis not present

## 2022-06-26 DIAGNOSIS — D631 Anemia in chronic kidney disease: Secondary | ICD-10-CM | POA: Diagnosis not present

## 2022-08-08 DIAGNOSIS — N183 Chronic kidney disease, stage 3 unspecified: Secondary | ICD-10-CM | POA: Diagnosis not present

## 2022-09-02 DIAGNOSIS — E1122 Type 2 diabetes mellitus with diabetic chronic kidney disease: Secondary | ICD-10-CM | POA: Diagnosis not present

## 2022-09-02 DIAGNOSIS — E782 Mixed hyperlipidemia: Secondary | ICD-10-CM | POA: Diagnosis not present

## 2022-09-02 DIAGNOSIS — N1831 Chronic kidney disease, stage 3a: Secondary | ICD-10-CM | POA: Diagnosis not present

## 2022-09-02 DIAGNOSIS — U071 COVID-19: Secondary | ICD-10-CM | POA: Diagnosis not present

## 2022-09-02 DIAGNOSIS — I10 Essential (primary) hypertension: Secondary | ICD-10-CM | POA: Diagnosis not present

## 2022-09-09 ENCOUNTER — Ambulatory Visit: Payer: Medicare PPO | Admitting: Podiatry

## 2022-11-05 DIAGNOSIS — E1142 Type 2 diabetes mellitus with diabetic polyneuropathy: Secondary | ICD-10-CM | POA: Diagnosis not present

## 2022-11-05 DIAGNOSIS — E782 Mixed hyperlipidemia: Secondary | ICD-10-CM | POA: Diagnosis not present

## 2022-11-05 DIAGNOSIS — I10 Essential (primary) hypertension: Secondary | ICD-10-CM | POA: Diagnosis not present

## 2022-11-05 DIAGNOSIS — Z79899 Other long term (current) drug therapy: Secondary | ICD-10-CM | POA: Diagnosis not present

## 2022-11-05 DIAGNOSIS — Z0000 Encounter for general adult medical examination without abnormal findings: Secondary | ICD-10-CM | POA: Diagnosis not present

## 2022-11-05 DIAGNOSIS — D631 Anemia in chronic kidney disease: Secondary | ICD-10-CM | POA: Diagnosis not present

## 2022-11-05 DIAGNOSIS — E039 Hypothyroidism, unspecified: Secondary | ICD-10-CM | POA: Diagnosis not present

## 2022-11-05 DIAGNOSIS — E1122 Type 2 diabetes mellitus with diabetic chronic kidney disease: Secondary | ICD-10-CM | POA: Diagnosis not present

## 2022-11-05 DIAGNOSIS — N1831 Chronic kidney disease, stage 3a: Secondary | ICD-10-CM | POA: Diagnosis not present

## 2022-11-05 DIAGNOSIS — N2581 Secondary hyperparathyroidism of renal origin: Secondary | ICD-10-CM | POA: Diagnosis not present

## 2022-11-08 DIAGNOSIS — R030 Elevated blood-pressure reading, without diagnosis of hypertension: Secondary | ICD-10-CM | POA: Diagnosis not present

## 2022-11-08 DIAGNOSIS — H6091 Unspecified otitis externa, right ear: Secondary | ICD-10-CM | POA: Diagnosis not present

## 2022-11-08 DIAGNOSIS — E669 Obesity, unspecified: Secondary | ICD-10-CM | POA: Diagnosis not present

## 2022-11-08 DIAGNOSIS — Z6832 Body mass index (BMI) 32.0-32.9, adult: Secondary | ICD-10-CM | POA: Diagnosis not present

## 2022-11-15 DIAGNOSIS — Z6833 Body mass index (BMI) 33.0-33.9, adult: Secondary | ICD-10-CM | POA: Diagnosis not present

## 2022-11-15 DIAGNOSIS — H6091 Unspecified otitis externa, right ear: Secondary | ICD-10-CM | POA: Diagnosis not present

## 2022-11-15 DIAGNOSIS — E669 Obesity, unspecified: Secondary | ICD-10-CM | POA: Diagnosis not present

## 2022-11-21 DIAGNOSIS — N183 Chronic kidney disease, stage 3 unspecified: Secondary | ICD-10-CM | POA: Diagnosis not present

## 2022-12-19 DIAGNOSIS — Z4422 Encounter for fitting and adjustment of artificial left eye: Secondary | ICD-10-CM | POA: Diagnosis not present

## 2023-01-03 DIAGNOSIS — G629 Polyneuropathy, unspecified: Secondary | ICD-10-CM | POA: Diagnosis not present

## 2023-01-03 DIAGNOSIS — I129 Hypertensive chronic kidney disease with stage 1 through stage 4 chronic kidney disease, or unspecified chronic kidney disease: Secondary | ICD-10-CM | POA: Diagnosis not present

## 2023-01-03 DIAGNOSIS — N179 Acute kidney failure, unspecified: Secondary | ICD-10-CM | POA: Diagnosis not present

## 2023-01-03 DIAGNOSIS — E66811 Obesity, class 1: Secondary | ICD-10-CM | POA: Diagnosis not present

## 2023-01-03 DIAGNOSIS — N183 Chronic kidney disease, stage 3 unspecified: Secondary | ICD-10-CM | POA: Diagnosis not present

## 2023-01-03 DIAGNOSIS — N2581 Secondary hyperparathyroidism of renal origin: Secondary | ICD-10-CM | POA: Diagnosis not present

## 2023-01-03 DIAGNOSIS — D631 Anemia in chronic kidney disease: Secondary | ICD-10-CM | POA: Diagnosis not present

## 2023-01-03 DIAGNOSIS — E1122 Type 2 diabetes mellitus with diabetic chronic kidney disease: Secondary | ICD-10-CM | POA: Diagnosis not present

## 2023-02-20 DIAGNOSIS — N183 Chronic kidney disease, stage 3 unspecified: Secondary | ICD-10-CM | POA: Diagnosis not present

## 2023-03-13 DIAGNOSIS — H2511 Age-related nuclear cataract, right eye: Secondary | ICD-10-CM | POA: Diagnosis not present

## 2023-03-13 DIAGNOSIS — Q111 Other anophthalmos: Secondary | ICD-10-CM | POA: Diagnosis not present

## 2023-03-13 DIAGNOSIS — E119 Type 2 diabetes mellitus without complications: Secondary | ICD-10-CM | POA: Diagnosis not present

## 2023-03-13 LAB — HM DIABETES EYE EXAM

## 2023-05-14 DIAGNOSIS — N2581 Secondary hyperparathyroidism of renal origin: Secondary | ICD-10-CM | POA: Diagnosis not present

## 2023-05-14 DIAGNOSIS — N400 Enlarged prostate without lower urinary tract symptoms: Secondary | ICD-10-CM | POA: Diagnosis not present

## 2023-05-14 DIAGNOSIS — E039 Hypothyroidism, unspecified: Secondary | ICD-10-CM | POA: Diagnosis not present

## 2023-05-14 DIAGNOSIS — H6121 Impacted cerumen, right ear: Secondary | ICD-10-CM | POA: Diagnosis not present

## 2023-05-14 DIAGNOSIS — E1122 Type 2 diabetes mellitus with diabetic chronic kidney disease: Secondary | ICD-10-CM | POA: Diagnosis not present

## 2023-05-14 DIAGNOSIS — I10 Essential (primary) hypertension: Secondary | ICD-10-CM | POA: Diagnosis not present

## 2023-05-14 DIAGNOSIS — D631 Anemia in chronic kidney disease: Secondary | ICD-10-CM | POA: Diagnosis not present

## 2023-05-14 DIAGNOSIS — N1831 Chronic kidney disease, stage 3a: Secondary | ICD-10-CM | POA: Diagnosis not present

## 2023-05-14 DIAGNOSIS — E1142 Type 2 diabetes mellitus with diabetic polyneuropathy: Secondary | ICD-10-CM | POA: Diagnosis not present

## 2023-05-26 DIAGNOSIS — R3914 Feeling of incomplete bladder emptying: Secondary | ICD-10-CM | POA: Diagnosis not present

## 2023-05-26 DIAGNOSIS — N401 Enlarged prostate with lower urinary tract symptoms: Secondary | ICD-10-CM | POA: Diagnosis not present

## 2023-05-26 DIAGNOSIS — R351 Nocturia: Secondary | ICD-10-CM | POA: Diagnosis not present

## 2023-05-30 DIAGNOSIS — N183 Chronic kidney disease, stage 3 unspecified: Secondary | ICD-10-CM | POA: Diagnosis not present

## 2023-07-07 DIAGNOSIS — I129 Hypertensive chronic kidney disease with stage 1 through stage 4 chronic kidney disease, or unspecified chronic kidney disease: Secondary | ICD-10-CM | POA: Diagnosis not present

## 2023-07-07 DIAGNOSIS — E1122 Type 2 diabetes mellitus with diabetic chronic kidney disease: Secondary | ICD-10-CM | POA: Diagnosis not present

## 2023-07-07 DIAGNOSIS — N2581 Secondary hyperparathyroidism of renal origin: Secondary | ICD-10-CM | POA: Diagnosis not present

## 2023-07-07 DIAGNOSIS — N183 Chronic kidney disease, stage 3 unspecified: Secondary | ICD-10-CM | POA: Diagnosis not present

## 2023-07-07 DIAGNOSIS — D631 Anemia in chronic kidney disease: Secondary | ICD-10-CM | POA: Diagnosis not present

## 2023-07-17 DIAGNOSIS — D171 Benign lipomatous neoplasm of skin and subcutaneous tissue of trunk: Secondary | ICD-10-CM | POA: Diagnosis not present

## 2023-07-17 DIAGNOSIS — C4441 Basal cell carcinoma of skin of scalp and neck: Secondary | ICD-10-CM | POA: Diagnosis not present

## 2023-07-17 DIAGNOSIS — Z85828 Personal history of other malignant neoplasm of skin: Secondary | ICD-10-CM | POA: Diagnosis not present

## 2023-07-17 DIAGNOSIS — C44319 Basal cell carcinoma of skin of other parts of face: Secondary | ICD-10-CM | POA: Diagnosis not present

## 2023-07-17 DIAGNOSIS — D485 Neoplasm of uncertain behavior of skin: Secondary | ICD-10-CM | POA: Diagnosis not present

## 2023-07-17 DIAGNOSIS — Z85820 Personal history of malignant melanoma of skin: Secondary | ICD-10-CM | POA: Diagnosis not present

## 2023-07-17 DIAGNOSIS — L821 Other seborrheic keratosis: Secondary | ICD-10-CM | POA: Diagnosis not present

## 2023-07-17 DIAGNOSIS — C44311 Basal cell carcinoma of skin of nose: Secondary | ICD-10-CM | POA: Diagnosis not present

## 2023-08-07 DIAGNOSIS — N3941 Urge incontinence: Secondary | ICD-10-CM | POA: Diagnosis not present

## 2023-08-07 DIAGNOSIS — R3914 Feeling of incomplete bladder emptying: Secondary | ICD-10-CM | POA: Diagnosis not present

## 2023-08-07 DIAGNOSIS — N401 Enlarged prostate with lower urinary tract symptoms: Secondary | ICD-10-CM | POA: Diagnosis not present

## 2023-08-12 DIAGNOSIS — M25552 Pain in left hip: Secondary | ICD-10-CM | POA: Diagnosis not present

## 2023-08-12 DIAGNOSIS — M25551 Pain in right hip: Secondary | ICD-10-CM | POA: Diagnosis not present

## 2023-08-15 DIAGNOSIS — N183 Chronic kidney disease, stage 3 unspecified: Secondary | ICD-10-CM | POA: Diagnosis not present

## 2023-09-02 HISTORY — PX: MELANOMA EXCISION: SHX5266

## 2023-09-03 DIAGNOSIS — M545 Low back pain, unspecified: Secondary | ICD-10-CM | POA: Diagnosis not present

## 2023-09-17 DIAGNOSIS — Z85828 Personal history of other malignant neoplasm of skin: Secondary | ICD-10-CM | POA: Diagnosis not present

## 2023-09-17 DIAGNOSIS — C44311 Basal cell carcinoma of skin of nose: Secondary | ICD-10-CM | POA: Diagnosis not present

## 2023-09-17 DIAGNOSIS — Z85820 Personal history of malignant melanoma of skin: Secondary | ICD-10-CM | POA: Diagnosis not present

## 2023-10-23 DIAGNOSIS — N3941 Urge incontinence: Secondary | ICD-10-CM | POA: Diagnosis not present

## 2023-10-23 DIAGNOSIS — R3914 Feeling of incomplete bladder emptying: Secondary | ICD-10-CM | POA: Diagnosis not present

## 2023-10-27 DIAGNOSIS — Z4422 Encounter for fitting and adjustment of artificial left eye: Secondary | ICD-10-CM | POA: Diagnosis not present

## 2023-10-31 DIAGNOSIS — N401 Enlarged prostate with lower urinary tract symptoms: Secondary | ICD-10-CM | POA: Diagnosis not present

## 2023-10-31 DIAGNOSIS — R3914 Feeling of incomplete bladder emptying: Secondary | ICD-10-CM | POA: Diagnosis not present

## 2023-10-31 DIAGNOSIS — R351 Nocturia: Secondary | ICD-10-CM | POA: Diagnosis not present

## 2023-10-31 DIAGNOSIS — R3912 Poor urinary stream: Secondary | ICD-10-CM | POA: Diagnosis not present

## 2023-11-11 DIAGNOSIS — E782 Mixed hyperlipidemia: Secondary | ICD-10-CM | POA: Diagnosis not present

## 2023-11-11 DIAGNOSIS — I10 Essential (primary) hypertension: Secondary | ICD-10-CM | POA: Diagnosis not present

## 2023-11-11 DIAGNOSIS — D631 Anemia in chronic kidney disease: Secondary | ICD-10-CM | POA: Diagnosis not present

## 2023-11-11 DIAGNOSIS — Z1331 Encounter for screening for depression: Secondary | ICD-10-CM | POA: Diagnosis not present

## 2023-11-11 DIAGNOSIS — N1831 Chronic kidney disease, stage 3a: Secondary | ICD-10-CM | POA: Diagnosis not present

## 2023-11-11 DIAGNOSIS — E1122 Type 2 diabetes mellitus with diabetic chronic kidney disease: Secondary | ICD-10-CM | POA: Diagnosis not present

## 2023-11-11 DIAGNOSIS — Z0000 Encounter for general adult medical examination without abnormal findings: Secondary | ICD-10-CM | POA: Diagnosis not present

## 2023-11-11 DIAGNOSIS — N2581 Secondary hyperparathyroidism of renal origin: Secondary | ICD-10-CM | POA: Diagnosis not present

## 2023-11-11 DIAGNOSIS — E039 Hypothyroidism, unspecified: Secondary | ICD-10-CM | POA: Diagnosis not present

## 2023-11-11 DIAGNOSIS — N400 Enlarged prostate without lower urinary tract symptoms: Secondary | ICD-10-CM | POA: Diagnosis not present

## 2023-11-11 DIAGNOSIS — Z79899 Other long term (current) drug therapy: Secondary | ICD-10-CM | POA: Diagnosis not present

## 2023-12-01 DIAGNOSIS — N1831 Chronic kidney disease, stage 3a: Secondary | ICD-10-CM | POA: Diagnosis not present

## 2023-12-05 ENCOUNTER — Other Ambulatory Visit: Payer: Self-pay | Admitting: Urology

## 2023-12-05 ENCOUNTER — Telehealth: Payer: Self-pay | Admitting: Cardiology

## 2023-12-05 NOTE — Telephone Encounter (Signed)
 Left message to call back and schedule appt in office. Pt still in time frame where he can see APP, but after 01/09/24 he will need a new pt appt.

## 2023-12-05 NOTE — Telephone Encounter (Signed)
   Pre-operative Risk Assessment    Patient Name: [name]  DOB: Mar 13, 1940 MRN: [id number]   Date of last office visit: 01/08/2021 Date of next office visit: TBD   Request for Surgical Clearance    Procedure:  Bipolar transurethral resection of the prostate   Date of Surgery:  Clearance 01/07/24                                Surgeon:  Dr. Lonni Han  Surgeon's Group or Practice Name:  [facility]  Phone number:  [phone] EXT: [phone] Fax number:  [phone]   Type of Clearance Requested:   - Medical    Type of Anesthesia:  General    Additional requests/questions:    SignedBernarda JONETTA Fireman   12/05/2023, 1:43 PM

## 2023-12-05 NOTE — Telephone Encounter (Signed)
   Name: [name]  DOB: 04-12-1940  MRN: [id number]  Primary Cardiologist: None  Chart reviewed as part of pre-operative protocol coverage. Because of Macgregor E Gervais's past medical history and time since last visit, he will require a follow-up in-office visit in order to better assess preoperative.  cardiovascular risk. Last seen by Dr. Anner on 01/08/2021  Pre-op covering staff: - Please schedule appointment and call patient to inform them. If patient already had an upcoming appointment within acceptable timeframe, please add pre-op clearance to the appointment notes so provider is aware. - Please contact requesting surgeon's office via preferred method (i.e, phone, fax) to inform them of need for appointment prior to surgery.   Lamarr Satterfield, NP  12/05/2023, 1:47 PM

## 2023-12-08 NOTE — Telephone Encounter (Signed)
 Patient returned Pre-op call and scheduled office visit on 10/24 with M. Rana, NP.

## 2023-12-08 NOTE — Telephone Encounter (Signed)
 Left message pt call back and let operator know he needs appt in office for preop clearance.

## 2023-12-08 NOTE — Telephone Encounter (Signed)
 Spoke with pts daughter asking her to tell pt to call our office to schedule OV for preop clearance. Please reach out to me or preop team if pt returns call thank you

## 2023-12-08 NOTE — Telephone Encounter (Signed)
 Patient is returning call.

## 2023-12-08 NOTE — Telephone Encounter (Signed)
 S/w the pt's daughter Stephane who said she told her dad (the pt) that we were trying to reach him . Dawn asked if I could call her dad as he is expecting the call now from us .

## 2023-12-19 DIAGNOSIS — Q111 Other anophthalmos: Secondary | ICD-10-CM | POA: Diagnosis not present

## 2023-12-19 DIAGNOSIS — H04321 Acute dacryocystitis of right lacrimal passage: Secondary | ICD-10-CM | POA: Diagnosis not present

## 2023-12-19 DIAGNOSIS — H2511 Age-related nuclear cataract, right eye: Secondary | ICD-10-CM | POA: Diagnosis not present

## 2023-12-23 DIAGNOSIS — H04321 Acute dacryocystitis of right lacrimal passage: Secondary | ICD-10-CM | POA: Diagnosis not present

## 2023-12-23 DIAGNOSIS — Q111 Other anophthalmos: Secondary | ICD-10-CM | POA: Diagnosis not present

## 2023-12-23 DIAGNOSIS — H2511 Age-related nuclear cataract, right eye: Secondary | ICD-10-CM | POA: Diagnosis not present

## 2023-12-24 ENCOUNTER — Encounter: Payer: Self-pay | Admitting: *Deleted

## 2023-12-24 NOTE — Patient Instructions (Signed)
 SURGICAL WAITING ROOM VISITATION Patients having surgery or a procedure may have no more than 2 support people in the waiting area - these visitors may rotate in the visitor waiting room.   If the patient needs to stay at the hospital during part of their recovery, the visitor guidelines for inpatient rooms apply.  PRE-OP VISITATION  Pre-op nurse will coordinate an appropriate time for 1 support person to accompany the patient in pre-op.  This support person may not rotate.  This visitor will be contacted when the time is appropriate for the visitor to come back in the pre-op area.  Please refer to the [facility] website for the visitor guidelines for Inpatients (after your surgery is over and you are in a regular room).  You are not required to quarantine at this time prior to your surgery. However, you must do this: Hand Hygiene often Do NOT share personal items Notify your provider if you are in close contact with someone who has COVID or you develop fever 100.4 or greater, new onset of sneezing, cough, sore throat, shortness of breath or body aches.  If you test positive for Covid or have been in contact with anyone that has tested positive in the last 10 days please notify you surgeon.    Your procedure is scheduled on:  Wednesday  January 07, 2024  Report to [facility] Main Entrance: [address] entrance where the [employer] is available.   Report to admitting at:   06:15  AM  Call this number if you have any questions or problems the morning of surgery [phone]  DO NOT EAT OR DRINK ANYTHING AFTER MIDNIGHT THE NIGHT PRIOR TO YOUR SURGERY / PROCEDURE.   FOLLOW  ANY ADDITIONAL PRE OP INSTRUCTIONS YOU RECEIVED FROM YOUR SURGEON'S OFFICE!!!   Oral Hygiene is also important to reduce your risk of infection.        Remember - BRUSH YOUR TEETH THE MORNING OF SURGERY WITH YOUR REGULAR TOOTHPASTE  Do NOT smoke after Midnight the night before surgery.  FARXIGA- Stop taking  72 hours before surgery. Last dose will be taken on Saturday  01-03-24  Glimepiride (AMARYL)-  Day BEFORE surgery, take as usual in the morning, no bedtime dose.                         DO NOT TAKE AMARYL THE MORNING OF YOUR SURGERY.   Pioglitazone (ACTOS)- Day BEFORE surgery, take as usual.                  DO NOT TAKE ACTOS THE DAY OF YOUR SURGERY.    STOP TAKING all Vitamins, Herbs and supplements 1 week before your surgery.   Take ONLY these medicines the morning of surgery with A SIP OF WATER: Levothyroxine , oxybutynin , Lyrica ??.   You may not have any metal on your body including  jewelry, and body piercing  Do not wear lotions, powders, cologne, or deodorant  Men may shave face and neck.  Contacts, Hearing Aids, dentures or bridgework may not be worn into surgery. DENTURES WILL BE REMOVED PRIOR TO SURGERY PLEASE DO NOT APPLY Poly grip OR ADHESIVES!!!  You may bring a small overnight bag with you on the day of surgery, only pack items that are not valuable. [facility] IS NOT RESPONSIBLE   FOR VALUABLES THAT ARE LOST OR STOLEN.   Patients discharged on the day of surgery will not be allowed to drive home.  Someone NEEDS to stay with you for the first 24 hours after anesthesia.  Do not bring your home medications to the hospital. The Pharmacy will dispense medications listed on your medication list to you during your admission in the Hospital.  Special Instructions: Bring a copy of your healthcare power of attorney and living will documents the day of surgery, if you wish to have them scanned into your [facility] Records- EPIC  Please read over the following fact sheets you were given: IF YOU HAVE QUESTIONS ABOUT YOUR PRE-OP INSTRUCTIONS, PLEASE CALL [phone].   [place] - Preparing for Surgery      Before surgery, you can play an important role.  Because skin is not sterile, your skin needs to be as free of germs as possible.  You can reduce the number of germs  on your skin by washing with CHG (chlorahexidine gluconate) soap before surgery.  CHG is an antiseptic cleaner which kills germs and bonds with the skin to continue killing germs even after washing. Please DO NOT use if you have an allergy to CHG or antibacterial soaps.  If your skin becomes reddened/irritated stop using the CHG and inform your nurse when you arrive at Short Stay. Do not shave (including legs and underarms) for at least 48 hours prior to the first CHG shower.  You may shave your face/neck.  Please follow these instructions carefully:  1.  Shower with CHG Soap the night before surgery ONLY (DO NOT USE THE CHG SOAP THE MORNING OF SURGERY).  2.  If you choose to wash your hair, wash your hair first as usual with your normal  shampoo.  3.  After you shampoo, rinse your hair and body thoroughly to remove the shampoo.                             4.  Use CHG as you would any other liquid soap.  You can apply chg directly to the skin and wash.  Gently with a scrungie or clean washcloth.  5.  Apply the CHG Soap to your body ONLY FROM THE NECK DOWN.   Do not use on face/ open                           Wound or open sores. Avoid contact with eyes, ears mouth and genitals (private parts).                       Wash face,  Genitals (private parts) with your normal soap.             6.  Wash thoroughly, paying special attention to the area where your  surgery  will be performed.  7.  Thoroughly rinse your body with warm water from the neck down.  8.  DO NOT shower/wash with your normal soap after using and rinsing off the CHG Soap.                9.  Pat yourself dry with a clean towel.            10.  Wear clean pajamas.            11.  Place clean sheets on your bed the night of your first shower and do not  sleep with pets.  Day of Surgery : Do not apply any CHG, lotions/deodorants the morning of  surgery.  Please wear clean clothes to the hospital/surgery center.   FAILURE TO FOLLOW  THESE INSTRUCTIONS MAY RESULT IN THE CANCELLATION OF YOUR SURGERY  PATIENT SIGNATURE_________________________________  NURSE SIGNATURE__________________________________  ________________________________________________________________________

## 2023-12-24 NOTE — Progress Notes (Signed)
 COVID Vaccine received:  []  No [x]  Yes Date of any COVID positive Test in last 90 days: None  PCP - Garnette Senters, MD [phone]  Cardiologist - Alm Clay, MD  (Has appt 12-26-23 w/ Lum Louis for clearance exam) Nephrology- Dr Rayburn at [facility]  Chest x-ray - 08-11-2015  2v  Epic EKG -  01-2021   repeat Stress Test - 09-13-2008  epic ECHO - (2D) 09-13-2008  Epic Cardiac Cath -  CT Coronary Calcium score:   Bowel Prep - [x]  No  []   Yes ______  Pacemaker / ICD device [x]  No []  Yes   Spinal Cord Stimulator:[x]  No []  Yes       History of Sleep Apnea? [x]  No []  Yes   CPAP used?- [x]  No []  Yes    Patient has: []  NO Hx DM   []  Pre-DM   []  DM1  [x]   DM2 Does the patient monitor blood sugar?   []  N/A   [x]  No []  Yes  Last A1c was:  6.5 on  11-12-2023    Does patient have a [employer] or Dexcom? [x]  No []  Yes   Fasting Blood Sugar Ranges- 90-110 Checks Blood Sugar 2 times a day  FARXIGA, AMARYL  ACTOS  Blood Thinner / Instructions: none Aspirin Instructions:  none  Dental hx: []  Dentures:  [x]  N/A      []  Bridge or Partial:                   []  Loose or Damaged teeth:   Activity level: Able to walk up 2 flights of stairs without becoming significantly short of breath or having chest pain?  []  No   [x]    Yes  Patient can perform ADLs without assistance. 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  Patient verbalized understanding and agreement to the Pre-Surgical Instructions that were given to them at this PAT appointment. Patient was also educated of the need to review these PAT instructions again prior to his surgery.I reviewed the appropriate phone numbers to call if they have any and questions or concerns.

## 2023-12-25 ENCOUNTER — Encounter (HOSPITAL_COMMUNITY): Payer: Self-pay

## 2023-12-25 ENCOUNTER — Encounter: Payer: Self-pay | Admitting: *Deleted

## 2023-12-25 ENCOUNTER — Other Ambulatory Visit: Payer: Self-pay

## 2023-12-25 ENCOUNTER — Encounter (HOSPITAL_COMMUNITY)
Admission: RE | Admit: 2023-12-25 | Discharge: 2023-12-25 | Disposition: A | Discharge status: Home / Self Care | Source: Ambulatory Visit | Attending: Urology | Admitting: Urology

## 2023-12-25 VITALS — BP 122/68 | HR 51 | Temp 97.6°F | Resp 16 | Ht 70.0 in | Wt 210.0 lb

## 2023-12-25 DIAGNOSIS — Z8614 Personal history of Methicillin resistant Staphylococcus aureus infection: Secondary | ICD-10-CM | POA: Diagnosis not present

## 2023-12-25 DIAGNOSIS — I10 Essential (primary) hypertension: Secondary | ICD-10-CM | POA: Insufficient documentation

## 2023-12-25 DIAGNOSIS — Z01818 Encounter for other preprocedural examination: Secondary | ICD-10-CM | POA: Diagnosis not present

## 2023-12-25 DIAGNOSIS — E119 Type 2 diabetes mellitus without complications: Secondary | ICD-10-CM | POA: Diagnosis not present

## 2023-12-25 HISTORY — DX: Personal history of other specified conditions: Z87.898

## 2023-12-25 HISTORY — DX: Cardiac arrhythmia, unspecified: I49.9

## 2023-12-25 HISTORY — DX: Myoneural disorder, unspecified: G70.9

## 2023-12-25 LAB — BASIC METABOLIC PANEL WITH GFR
Anion gap: 10 (ref 5–15)
BUN: 34 mg/dL — ABNORMAL HIGH (ref 8–23)
CO2: 22 mmol/L (ref 22–32)
Calcium: 9.7 mg/dL (ref 8.9–10.3)
Chloride: 109 mmol/L (ref 98–111)
Creatinine, Ser: 1.49 mg/dL — ABNORMAL HIGH (ref 0.61–1.24)
GFR, Estimated: 46 mL/min — ABNORMAL LOW (ref >60)
Glucose, Bld: 112 mg/dL — ABNORMAL HIGH (ref 70–99)
Potassium: 4.5 mmol/L (ref 3.5–5.1)
Sodium: 141 mmol/L (ref 135–145)

## 2023-12-25 LAB — CBC
HCT: 46.1 % (ref 39.0–52.0)
Hemoglobin: 14.2 g/dL (ref 13.0–17.0)
MCH: 28.3 pg (ref 26.0–34.0)
MCHC: 30.8 g/dL (ref 30.0–36.0)
MCV: 92 fL (ref 80.0–100.0)
Platelets: 155 K/uL (ref 150–400)
RBC: 5.01 MIL/uL (ref 4.22–5.81)
RDW: 14 % (ref 11.5–15.5)
WBC: 5.6 K/uL (ref 4.0–10.5)
nRBC: 0 % (ref 0.0–0.2)

## 2023-12-25 LAB — SURGICAL PCR SCREEN
MRSA, PCR: NEGATIVE
Staphylococcus aureus: NEGATIVE

## 2023-12-25 LAB — GLUCOSE, CAPILLARY: Glucose-Capillary: 112 mg/dL — ABNORMAL HIGH (ref 70–99)

## 2023-12-26 ENCOUNTER — Ambulatory Visit: Admitting: Emergency Medicine

## 2023-12-26 ENCOUNTER — Encounter (HOSPITAL_COMMUNITY): Payer: Self-pay | Admitting: Physician Assistant

## 2023-12-26 NOTE — Progress Notes (Deleted)
  Cardiology Office Note:    Date:  12/26/2023  ID:  [name], DOB 10/01/1940, MRN [id number] PCP: Nanci Senior, MD  [facility] Providers Cardiologist:  None { Click to update primary MD,subspecialty MD or APP then REFRESH:1}    {Click to Open Review  :1}   Patient Profile:       Chief Complaint: *** History of Present Illness:  [name] is a 83 y.o. male with visit-pertinent history of hypertension, hyperlipidemia, obesity, type 2 diabetes, metabolic syndrome  He underwent echocardiogram and Myoview in 2010 were unremarkable.  He has no documented history of coronary disease and previous evaluations have proven to be negative for significant CAD.  He was last seen in clinic on 01/08/2021 by Dr. Anner.  He was doing well without acute cardiovascular concerns.  Blood pressure and cholesterol remains fairly controlled.  He is now pending bipolar transurethral resection of the prostate on 01/07/2024 with [facility]  Discussed the use of AI scribe software for clinical note transcription with the patient, who gave verbal consent to proceed.  History of Present Illness     Review of systems:  Please see the history of present illness. All other systems are reviewed and otherwise negative. ***      Studies Reviewed:        ***  Risk Assessment/Calculations:   {Does this patient have ATRIAL FIBRILLATION?[phone]}          Physical Exam:   VS:  There were no vitals taken for this visit.   Wt Readings from Last 3 Encounters:  12/25/23 210 lb (95.3 kg)  01/08/21 231 lb 12.8 oz (105.1 kg)  11/05/19 221 lb 6.4 oz (100.4 kg)    GEN: Well nourished, well developed in no acute distress NECK: No JVD; No carotid bruits CARDIAC: ***RRR, no murmurs, rubs, gallops RESPIRATORY:  Clear to auscultation without rales, wheezing or rhonchi  ABDOMEN: Soft, non-tender, non-distended EXTREMITIES:  No edema; No acute deformity ***      Assessment and Plan:     Assessment and Plan Assessment & Plan      {Are you ordering a CV Procedure (e.g. stress test, cath, DCCV, TEE, etc)?   Press F2        :[phone]}  Dispo:  No follow-ups on file.  Signed, Lum LITTIE Louis, NP

## 2024-01-07 ENCOUNTER — Ambulatory Visit (HOSPITAL_COMMUNITY): Admission: RE | Admit: 2024-01-07 | Admitting: Urology

## 2024-01-07 ENCOUNTER — Encounter (HOSPITAL_COMMUNITY): Admission: RE | Payer: Self-pay | Source: Ambulatory Visit

## 2024-01-07 SURGERY — TURP (TRANSURETHRAL RESECTION OF PROSTATE)
Anesthesia: General

## 2024-03-17 LAB — OPHTHALMOLOGY REPORT-SCANNED
